# Patient Record
Sex: Female | Born: 1995 | Race: Black or African American | Hispanic: No | Marital: Married | Smoking: Never smoker
Health system: Southern US, Community
[De-identification: ages and names within clinical notes are randomized; demographics above are authoritative.]

## PROBLEM LIST (undated history)

## (undated) DIAGNOSIS — Z789 Other specified health status: Secondary | ICD-10-CM

## (undated) HISTORY — DX: Other specified health status: Z78.9

---

## 2017-06-11 HISTORY — PX: KNEE SURGERY: SHX244

## 2017-10-07 DIAGNOSIS — E049 Nontoxic goiter, unspecified: Secondary | ICD-10-CM | POA: Insufficient documentation

## 2017-12-03 DIAGNOSIS — S86812A Strain of other muscle(s) and tendon(s) at lower leg level, left leg, initial encounter: Secondary | ICD-10-CM | POA: Insufficient documentation

## 2020-11-21 ENCOUNTER — Ambulatory Visit (INDEPENDENT_AMBULATORY_CARE_PROVIDER_SITE_OTHER): Payer: Managed Care, Other (non HMO) | Admitting: Certified Nurse Midwife

## 2020-11-21 ENCOUNTER — Other Ambulatory Visit: Payer: Self-pay

## 2020-11-21 VITALS — BP 127/76 | HR 80 | Ht 65.0 in | Wt 188.6 lb

## 2020-11-21 DIAGNOSIS — N926 Irregular menstruation, unspecified: Secondary | ICD-10-CM

## 2020-11-21 MED ORDER — DOXYLAMINE-PYRIDOXINE 10-10 MG PO TBEC: 1.0000 | DELAYED_RELEASE_TABLET | Freq: Four times a day (QID) | ORAL | 5 refills | Status: AC

## 2020-11-21 NOTE — Progress Notes (Signed)
Subjective:    Stacey Sanford is a 25 y.o. female who presents for evaluation of amenorrhea, Positive pregnancy test [facility].. She believes she could be pregnant. Pregnancy is desired. Sexual Activity: single partner, contraception: none. Current symptoms also include: fatigue and nausea. Last period was unsure of dates.   No LMP recorded. The following portions of the patient's history were reviewed and updated as appropriate: allergies, current medications, past family history, past medical history, past social history, past surgical history, and problem list.  Review of Systems Pertinent items are noted in HPI.     Objective:    There were no vitals taken for this visit. General: alert, cooperative, appears stated age, and no acute distress    Lab Review Urine HCG:  not needed pt has at [facility] May.     Assessment:    Absence of menstruation.     Plan:    Pregnancy Test: Positive: EDC: 06/02/21. Briefly discussed pre-natal care options. Md and midwifery care reviewed.  Encouraged well-balanced diet, plenty of rest when needed, pre-natal vitamins daily and walking for exercise. Discussed self-help for nausea, avoiding OTC medications until consulting provider or pharmacist, other than Tylenol as needed, minimal caffeine (1-2 cups daily) and avoiding alcohol. She will schedule her initial OB visit in the next week.  Feel free to call with any questions.  Orders placed for diclegis.   Philip Aspen, CNM

## 2020-11-24 ENCOUNTER — Ambulatory Visit
Admission: RE | Admit: 2020-11-24 | Discharge: 2020-11-24 | Disposition: A | Discharge status: Home / Self Care | Payer: Self-pay | Source: Ambulatory Visit | Attending: Certified Nurse Midwife | Admitting: Certified Nurse Midwife

## 2020-11-24 ENCOUNTER — Other Ambulatory Visit: Payer: Self-pay

## 2020-11-24 ENCOUNTER — Other Ambulatory Visit: Payer: Self-pay | Admitting: Certified Nurse Midwife

## 2020-11-24 DIAGNOSIS — N926 Irregular menstruation, unspecified: Secondary | ICD-10-CM | POA: Insufficient documentation

## 2020-11-28 ENCOUNTER — Ambulatory Visit (INDEPENDENT_AMBULATORY_CARE_PROVIDER_SITE_OTHER): Payer: Self-pay

## 2020-11-28 ENCOUNTER — Other Ambulatory Visit: Payer: Self-pay

## 2020-11-28 VITALS — BP 132/77 | HR 83 | Ht 65.0 in | Wt 187.9 lb

## 2020-11-28 DIAGNOSIS — Z0283 Encounter for blood-alcohol and blood-drug test: Secondary | ICD-10-CM | POA: Diagnosis not present

## 2020-11-28 DIAGNOSIS — R638 Other symptoms and signs concerning food and fluid intake: Secondary | ICD-10-CM | POA: Diagnosis not present

## 2020-11-28 DIAGNOSIS — Z3401 Encounter for supervision of normal first pregnancy, first trimester: Secondary | ICD-10-CM

## 2020-11-28 DIAGNOSIS — Z113 Encounter for screening for infections with a predominantly sexual mode of transmission: Secondary | ICD-10-CM

## 2020-11-28 NOTE — Progress Notes (Signed)
      Jase Himmelberger presents for NOB nurse intake visit. Pregnancy confirmation done at [facility] ,11/21/2020, with Philip Aspen.  G.  P.  LMP3/18/2022.  EDD.  [id number]. Pregnancy education material explained and given.  0 cats in the home.  NOB labs ordered. BMI greater than 30. TSH/HbgA1c ordered. Sickle cell order due to race. HIV and drug screen explained and ordered. Genetic screening discussed. Genetic testing; Pt is aware that genetic testing will be completed during her NOB PE with the provider. Pt to discuss genetic testing with provider. PNV encouraged. Pt to follow up with provider in 1 weeks for NOB physical.   FMLA, [place], HIV/Drug Screening forms all reviewed and signed by patient.    Pt requested to see the providers over the midwives.

## 2020-11-28 NOTE — Patient Instructions (Signed)
WHAT OB PATIENTS CAN EXPECT  Confirmation of pregnancy and ultrasound ordered if medically indicated-[redacted] weeks gestation New OB (NOB) intake with nurse and New OB (NOB) labs- [redacted] weeks gestation New OB (NOB) physical examination with provider- 11/[redacted] weeks gestation Flu vaccine-[redacted] weeks gestation Anatomy scan-[redacted] weeks gestation Glucose tolerance test, blood work to test for anemia, T-dap vaccine-[redacted] weeks gestation Vaginal swabs/cultures-STD/Group B strep-[redacted] weeks gestation Appointments every 4 weeks until 28 weeks Every 2 weeks from 28 weeks until 36 weeks Weekly visits from 36 weeks until delivery  Tests and Screening During Pregnancy Having certain tests and screenings during pregnancy is an important part of your prenatal care. These tests help your health care provider find problems that might affect your pregnancy. Some tests must be done for all pregnant women, and some are optional. Most of the tests and screenings do not pose any risks for you or your baby. You may need additional testing if any routinetests indicate a problem. Tests and screenings done early in pregnancy Some tests and screenings you can expect to have in early pregnancy include: Blood tests, such as: Complete blood count (CBC). This test is done to check your red and white blood cells. It can help identify a risk for anemia, infection, or bleeding. Blood typing. This test shows your blood type. It also shows whether you have a certain protein in your red blood cells called the Rh factor. It can be dangerous for your baby if you do not have this protein (Rh negative) and your baby has it (Rh positive). Tests to check for diseases that can cause birth defects or can be passed to your baby, such as: [place] measles (rubella) and chicken pox. The test indicates whether you are immune to these diseases. Hepatitis B and C. Human Immunodeficiency Virus (HIV). Syphilis. Zika virus, if you or your partner has traveled to an area  where the virus occurs. Urine testing. This checks for sugar in your urine and for signs of infection. Blood pressure. This is to check for high blood pressure and preeclampsia. Testing for sexually transmitted infections (STIs), such as chlamydia or gonorrhea. Testing for tuberculosis. You may have this skin test if you are at risk for tuberculosis. Fetal ultrasound. This is an imaging study of your growing baby. It uses sound waves to create pictures of your baby. This test may be done to help determine your due date and to ensure you do not have an ectopic pregnancy. An ectopic pregnancy is a pregnancy that grows outside of the uterus. Tests and screenings done later in pregnancy Certain tests are done for the first time later in the pregnancy. Some of the tests that were done in early pregnancy are repeated at this time. Some common tests you can expect to have later in pregnancy include: Rh antibody testing. If you are Rh negative, you will have a blood test at about 28 weeks of pregnancy to see if you are producing Rh antibodies. If you have not started to make antibodies, you will be given an injection to prevent you from making antibodies for the rest of your pregnancy. Glucose screening. This checks your blood sugar. It will show whether you are developing the type of diabetes that occurs during pregnancy (gestational diabetes). You may have this screening earlier if you have risk factors for diabetes. Screening for group B streptococcus (GBS). GBS is a type of bacteria that may live in your rectum or vagina. GBS can spread to your baby during birth. This  is done at 35-37 weeks of pregnancy. If testing is positive for GBS, you may be treated with antibiotic medicine. Urine and blood tests to monitor for other pregnancy problems, such as preeclampsia or anemia. Blood pressure to monitor for high blood pressure and preeclampsia. Fetal ultrasound. This may be repeated at 16-20 weeks to check how  your baby is growing and developing. Non-stress test. This test is done later in pregnancy to check your baby's heart rate. This may be repeated weekly if your pregnancy is high risk. Biophysical profile. This test includes ultrasound imaging and a non-stress test to ensure your baby is healthy. This test may help decide when your baby should be born. Screening for birth defects Some birth defects are caused by abnormal genes passed down through families. Early in your pregnancy, tests can be done to find out if your baby is at risk for a genetic disorder. This testing is optional. The type of testing recommended for you will depend on your family and medical history, your ethnicity, and your age. Testing may include: Screening tests. These tests may include an ultrasound, blood tests, or a combination of both. The blood tests are used to check for abnormal genes and the ultrasound is done to look for early birth defects. Carrier screening. This test involves checking the blood or saliva of both parents to see if they carry abnormal genes that could be passed down to a baby. If genetic screening shows that your baby is at risk for a genetic defect, additional diagnostic testing may be recommended, such as: Amniocentesis. This involves testing a sample of fluid from your womb (amniotic fluid). Chorionic villus sampling. In this test, a sample of cells from your placenta is checked for abnormal cells. Unlike other tests done during pregnancy, diagnostic testing does have some risk for your pregnancy. Talk to your health care provider about the risks andbenefits of genetic testing. Questions to ask your health care provider What routine tests are recommended for me? When and how will these tests be done? When will I get the results of routine tests? What do the results of these tests mean for me or my baby? Do you recommend any genetic screening tests? Which ones? Should I see a genetic counselor  before having genetic screening? Where to find more information American Pregnancy Association: americanpregnancy.org/prenatal-testing [employer]: JewelryExec.com.pt Office on [employer] Health: KeywordPortfolios.com.br [employer]: marchofdimes.org/pregnancy Summary Having certain tests and screenings during pregnancy is an important part of your prenatal care. Talk to your health care provider about what tests are right for you and your baby. In early pregnancy, testing may be done to check your risks for various conditions that can affect you and your baby. Later in pregnancy, tests may be done to ensure that your baby is growing normally and that you and your baby are staying healthy during the pregnancy. Genetic testing is optional. Talk to your health care provider about the risks and benefits of genetic testing. This information is not intended to replace advice given to you by your health care provider. Make sure you discuss any questions you have with your healthcare provider. Document Revised: 02/16/2020 Document Reviewed: 02/16/2020 Elsevier Patient Education  2022 [place] [employer] Pregnancy  The second trimester of pregnancy is from week 13 through week 27. This is also called months 4 through 6 of pregnancy. This is often the time when you feelyour best. During the second trimester: Morning sickness is less or  has stopped. You may have more energy. You may feel hungry more often. At this time, your unborn baby (fetus) is growing very fast. At the end of the sixth month, the unborn baby may be up to 12 inches long and weigh about 1 pounds. You will likely start to feelthe baby move between 16 and 20 weeks of pregnancy. Body changes during your second trimester Your body continues to go through many changes during this time. The changesvary and generally return to normal after the baby is born. Physical changes You will gain  more weight. You may start to get stretch marks on your hips, belly (abdomen), and breasts. Your breasts will grow and may hurt. Dark spots or blotches may develop on your face. A dark line from your belly button to the pubic area (linea nigra) may appear. You may have changes in your hair. Health changes You may have headaches. You may have heartburn. You may have trouble pooping (constipation). You may have hemorrhoids or swollen, bulging veins (varicose veins). Your gums may bleed. You may pee (urinate) more often. You may have back pain. Follow these instructions at home: Medicines Take over-the-counter and prescription medicines only as told by your doctor. Some medicines are not safe during pregnancy. Take a prenatal vitamin that contains at least 600 micrograms (mcg) of folic acid. Eating and drinking Eat healthy meals that include: Fresh fruits and vegetables. Whole grains. Good sources of protein, such as meat, eggs, or tofu. Low-fat dairy products. Avoid raw meat and unpasteurized juice, milk, and cheese. You may need to take these actions to prevent or treat trouble pooping: Drink enough fluids to keep your pee (urine) pale yellow. Eat foods that are high in fiber. These include beans, whole grains, and fresh fruits and vegetables. Limit foods that are high in fat and sugar. These include fried or sweet foods. Activity Exercise only as told by your doctor. Most people can do their usual exercise during pregnancy. Try to exercise for 30 minutes at least 5 days a week. Stop exercising if you have pain or cramps in your belly or lower back. Do not exercise if it is too hot or too humid, or if you are in a place of great height (high altitude). Avoid heavy lifting. If you choose to, you may have sex unless your doctor tells you not to. Relieving pain and discomfort Wear a good support bra if your breasts are sore. Take warm water baths (sitz baths) to soothe pain or  discomfort caused by hemorrhoids. Use hemorrhoid cream if your doctor approves. Rest with your legs raised (elevated) if you have leg cramps or low back pain. If you develop bulging veins in your legs: Wear support hose as told by your doctor. Raise your feet for 15 minutes, 3-4 times a day. Limit salt in your food. Safety Wear your seat belt at all times when you are in a car. Talk with your doctor if someone is hurting you or yelling at you a lot. Lifestyle Do not use hot tubs, steam rooms, or saunas. Do not douche. Do not use tampons or scented sanitary pads. Avoid cat litter boxes and soil used by cats. These carry germs that can harm your baby and can cause a loss of your baby by miscarriage or stillbirth. Do not use herbal medicines, illegal drugs, or medicines that are not approved by your doctor. Do not drink alcohol. Do not smoke or use any products that contain nicotine or tobacco. If you need  help quitting, ask your doctor. General instructions Keep all follow-up visits. This is important. Ask your doctor about local prenatal classes. Ask your doctor about the right foods to eat or for help finding a counselor. Where to find more information American Pregnancy Association: americanpregnancy.org [employer]ians [employer]ologists: www.acog.org Office on [employer] Health: KeywordPortfolios.com.br Contact a doctor if: You have a headache that does not go away when you take medicine. You have changes in how you see, or you see spots in front of your eyes. You have mild cramps, pressure, or pain in your lower belly. You continue to feel like you may vomit (nauseous), you vomit, or you have watery poop (diarrhea). You have bad-smelling fluid coming from your vagina. You have pain when you pee or your pee smells bad. You have very bad swelling of your face, hands, ankles, feet, or legs. You have a fever. Get help right away if: You are leaking fluid from your  vagina. You have spotting or bleeding from your vagina. You have very bad belly cramping or pain. You have trouble breathing. You have chest pain. You faint. You have not felt your baby move for the time period told by your doctor. You have new or increased pain, swelling, or redness in an arm or leg. Summary The second trimester of pregnancy is from week 13 through week 27 (months 4 through 6). Eat healthy meals. Exercise as told by your doctor. Most people can do their usual exercise during pregnancy. Do not use herbal medicines, illegal drugs, or medicines that are not approved by your doctor. Do not drink alcohol. Call your doctor if you get sick or if you notice anything unusual about your pregnancy. This information is not intended to replace advice given to you by your health care provider. Make sure you discuss any questions you have with your healthcare provider. Document Revised: 11/04/2019 Document Reviewed: 09/10/2019 Elsevier Patient Education  [place]. Morning Sickness  Morning sickness is when you feel like you may vomit (feel nauseous) during pregnancy. Sometimes, you may vomit. Morning sickness most often happens in the morning, but it can also happen at any time of the day. Some women may have morning sickness that makes them vomit all the time. This is amore serious problem that needs treatment. What are the causes? The cause of this condition is not known. What increases the risk? You had vomiting or a feeling like you may vomit before your pregnancy. You had morning sickness in another pregnancy. You are pregnant with more than one baby, such as twins. What are the signs or symptoms? Feeling like you may vomit. Vomiting. How is this treated? Treatment is usually not needed for this condition. You may only need to change what you eat. In some cases, your doctor may give you some things to take for your condition. These include: Vitamin B6  supplements. Medicines to treat the feeling that you may vomit. Ginger. Follow these instructions at home: Medicines Take over-the-counter and prescription medicines only as told by your doctor. Do not take any medicines until you talk with your doctor about them first. Take multivitamins before you get pregnant. These can stop or lessen the symptoms of morning sickness. Eating and drinking Eat dry toast or crackers before getting out of bed. Eat 5 or 6 small meals a day. Eat dry and bland foods like rice and baked potatoes. Do not eat greasy, fatty, or spicy foods. Have someone cook for you if the smell  of food causes you to vomit or to feel like you may vomit. If you feel like you may vomit after taking prenatal vitamins, take them at night or with a snack. Eat protein foods when you need a snack. Nuts, yogurt, and cheese are good choices. Drink fluids throughout the day. Try ginger ale made with real ginger, ginger tea made from fresh grated ginger, or ginger candies. General instructions Do not smoke or use any products that contain nicotine or tobacco. If you need help quitting, ask your doctor. Use an air purifier to keep the air in your house free of smells. Get lots of fresh air. Try to avoid smells that make you feel sick. Try wearing an acupressure wristband. This is a wristband that is used to treat seasickness. Try a treatment called acupuncture. In this treatment, a doctor puts needles into certain areas of your body to make you feel better. Contact a doctor if: You need medicine to feel better. You feel dizzy or light-headed. You are losing weight. Get help right away if: The feeling that you may vomit will not go away, or you cannot stop vomiting. You faint. You have very bad pain in your belly. Summary Morning sickness is when you feel like you may vomit (feel nauseous) during pregnancy. You may feel sick in the morning, but you can feel this way at any time of the  day. Making some changes to what you eat may help your symptoms go away. This information is not intended to replace advice given to you by your health care provider. Make sure you discuss any questions you have with your healthcare provider. Document Revised: 01/11/2020 Document Reviewed: 12/21/2019 Elsevier Patient Education  2022 [employer]. How a Baby Grows During Pregnancy Pregnancy begins when a female's sperm enters a female's egg. This is called fertilization. Fertilization usually happens in one of the fallopian tubes that connect the ovaries to the uterus. The fertilized egg moves down the fallopian tube to the uterus. Once it reaches the uterus, it implants into the lining ofthe uterus and begins to grow. For the first 8 weeks, the fertilized egg is called an embryo. After 8 weeks, it is called a fetus. As the fetus continues to grow, it receives oxygen and nutrients through the placenta, which is an organ that grows to support the developing baby. The placenta is the life support system for the baby. Itprovides oxygen and nutrition and removes waste. How long does a typical pregnancy last? A pregnancy usually lasts 280 days, or about 40 weeks. Pregnancy is divided into three periods of growth, also called trimesters: First trimester: 0-12 weeks. Second trimester: 13-27 weeks. Third trimester: 28-40 weeks. The day when your baby is ready to be born (full term) is your estimated date of delivery. However, most babies are not born ontheir estimated date of delivery. How does my baby develop month by month?  First month The fertilized egg attaches to the inside of the uterus. Some cells will form the placenta. Others will form the fetus. The arms, legs, brain, spinal cord, lungs, and heart begin to develop. At the end of the first month, the heart begins to beat. Second month The bones, inner ear, eyelids, hands, and feet form. The genitals develop. By the end of 8 weeks, all major  organs are developing. Third month All of the internal organs are forming. Teeth develop below the gums. Bones and muscles begin to grow. The spine can flex. The skin is transparent. Fingernails  and toenails begin to form. Arms and legs continue to grow longer, and hands and feet develop. The fetus is about 3 inches (7.6 cm) long. Fourth month The placenta is completely formed. The external sex organs, neck, outer ear, eyebrows, eyelids, and fingernails are formed. The fetus can hear, swallow, and move its arms and legs. The kidneys begin to produce urine. The skin is covered with a white, waxy coating (vernix) and very fine hair (lanugo). Fifth month The fetus moves around more and can be felt for the first time (quickening). The fetus starts to sleep and wake up and may begin to suck a finger. The nails grow to the end of the fingers. The organ in the digestive system that makes bile (gallbladder) functions and helps to digest nutrients. If the fetus is a female, eggs are present in the ovaries. If the fetus is a female, testicles start to move down into the scrotum. Sixth month The lungs are formed. The eyes open. The brain continues to develop. Your baby has fingerprints and toe prints. Your baby's hair grows thicker. At the end of the second trimester, the fetus is about 9 inches (22.9 cm) long. Seventh month The fetus kicks and stretches. The eyes are developed enough to sense changes in light. The hands can make a grasping motion. The fetus responds to sound. Eighth month Most organs and body systems are fully developed and functioning. Bones harden, and taste buds develop. The fetus may hiccup. Certain areas of the brain are still developing. The skull remains soft. Ninth month The fetus gains about  lb (0.23 kg) each week. The lungs are fully developed. Patterns of sleep develop. The fetus's head typically moves into a head-down position (vertex) in the uterus to  prepare for birth. The fetus weighs 6-9 lb (2.72-4.08 kg) and is 19-20 inches (48.26-50.8 cm) long. How do I know if my baby is developing well? Always talk with your health care provider about any concerns that you may have about your pregnancy and your baby. At each prenatal visit, your health care provider will do several different tests to check on your health and keep track of your baby's development. These include: Fundal height and position. To do this, your health care provider will: Measure your growing belly from your pubic bone to the top of the uterus using a tape measure. Feel your belly to determine your baby's position. Heartbeat. An ultrasound in the first trimester can confirm pregnancy and show a heartbeat, depending on how far along you are. Your health care provider will check your baby's heart rate at every prenatal visit. You will also have a second trimester ultrasound to check your baby'sdevelopment. Follow these instructions at home: Take prenatal vitamins as told by your health care provider. These include vitamins such as folic acid, iron, calcium, and vitamin D. They are important for healthy development. Take over-the-counter and prescription medicines only as told by your health care provider. Keep all follow-up visits. This is important. Follow-up visits include prenatal care and screening tests. Summary A pregnancy usually lasts 280 days, or about 40 weeks. Pregnancy is divided into three periods of growth, also called trimesters. Your health care provider will monitor your baby's growth and development throughout your pregnancy. Follow your health care provider's recommendations about taking prenatal vitamins and medicines during your pregnancy. Talk with your health care provider if you have any concerns about your pregnancy or your developing baby. This information is not intended to replace advice given to  you by your health care provider. Make sure you discuss  any questions you have with your healthcare provider. Document Revised: 11/04/2019 Document Reviewed: 09/10/2019 Elsevier Patient Education  2022 [place]. Commonly Asked Questions During Pregnancy  Cats: A parasite can be excreted in cat feces.  To avoid exposure you need to have another person empty the little box.  If you must empty the litter box you will need to wear gloves.  Wash your hands after handling your cat.  This parasite can also be found in raw or undercooked meat so this should also be avoided.  Colds, Sore Throats, Flu: Please check your medication sheet to see what you can take for symptoms.  If your symptoms are unrelieved by these medications please call the office.  Dental Work: Most any dental work Investment banker, corporate recommends is permitted.  X-rays should only be taken during the first trimester if absolutely necessary.  Your abdomen should be shielded with a lead apron during all x-rays.  Please notify your provider prior to receiving any x-rays.  Novocaine is fine; gas is not recommended.  If your dentist requires a note from [place] prior to dental work please call the office and we will provide one for you.  Exercise: Exercise is an important part of staying healthy during your pregnancy.  You may continue most exercises you were accustomed to prior to pregnancy.  Later in your pregnancy you will most likely notice you have difficulty with activities requiring balance like riding a bicycle.  It is important that you listen to your body and avoid activities that put you at a higher risk of falling.  Adequate rest and staying well hydrated are a must!  If you have questions about the safety of specific activities ask your provider.    Exposure to Children with illness: Try to avoid obvious exposure; report any symptoms to [place] when noted,  If you have chicken pos, red measles or mumps, you should be immune to these diseases.   Please do not take any vaccines while pregnant unless you  have checked with your OB provider.  Fetal Movement: After 28 weeks we recommend you do "kick counts" twice daily.  Lie or sit down in a calm quiet environment and count your baby movements "kicks".  You should feel your baby at least 10 times per hour.  If you have not felt 10 kicks within the first hour get up, walk around and have something sweet to eat or drink then repeat for an additional hour.  If count remains less than 10 per hour notify your provider.  Fumigating: Follow your pest control agent's advice as to how long to stay out of your home.  Ventilate the area well before re-entering.  Hemorrhoids:   Most over-the-counter preparations can be used during pregnancy.  Check your medication to see what is safe to use.  It is important to use a stool softener or fiber in your diet and to drink lots of liquids.  If hemorrhoids seem to be getting worse please call the office.   Hot Tubs:  Hot tubs Jacuzzis and saunas are not recommended while pregnant.  These increase your internal body temperature and should be avoided.  Intercourse:  Sexual intercourse is safe during pregnancy as long as you are comfortable, unless otherwise advised by your provider.  Spotting may occur after intercourse; report any bright red bleeding that is heavier than spotting.  Labor:  If you know that you are in labor, please  go to the hospital.  If you are unsure, please call the office and let us help you decide what to do.  Lifting, straining, etc:  If your job requires heavy lifting or straining please check with your provider for any limitations.  Generally, you should not lift items heavier than that you can lift simply with your hands and arms (no back muscles)  Painting:  Paint fumes do not harm your pregnancy, but may make you ill and should be avoided if possible.  Latex or water based paints have less odor than oils.  Use adequate ventilation while painting.  Permanents & Hair Color:  Chemicals in hair  dyes are not recommended as they cause increase hair dryness which can increase hair loss during pregnancy.  " Highlighting" and permanents are allowed.  Dye may be absorbed differently and permanents may not hold as well during pregnancy.  Sunbathing:  Use a sunscreen, as skin burns easily during pregnancy.  Drink plenty of fluids; avoid over heating.  Tanning Beds:  Because their possible side effects are still unknown, tanning beds are not recommended.  Ultrasound Scans:  Routine ultrasounds are performed at approximately 20 weeks.  You will be able to see your baby's general anatomy an if you would like to know the gender this can usually be determined as well.  If it is questionable when you conceived you may also receive an ultrasound early in your pregnancy for dating purposes.  Otherwise ultrasound exams are not routinely performed unless there is a medical necessity.  Although you can request a scan we ask that you pay for it when conducted because insurance does not cover " patient request" scans.  Work: If your pregnancy proceeds without complications you may work until your due date, unless your physician or employer advises otherwise.  Round Ligament Pain/Pelvic Discomfort:  Sharp, shooting pains not associated with bleeding are fairly common, usually occurring in the second trimester of pregnancy.  They tend to be worse when standing up or when you remain standing for long periods of time.  These are the result of pressure of certain pelvic ligaments called "round ligaments".  Rest, Tylenol and heat seem to be the most effective relief.  As the womb and fetus grow, they rise out of the pelvis and the discomfort improves.  Please notify the office if your pain seems different than that described.  It may represent a more serious condition.  Common Medications Safe in Pregnancy  Acne:      Constipation:  Benzoyl Peroxide     Colace  Clindamycin      Dulcolax Suppository  Topica  Erythromycin     Fibercon  Salicylic Acid      Metamucil         Miralax AVOID:        Senakot   Accutane    Cough:  Retin-A       Cough Drops  Tetracycline      Phenergan w/ Codeine if Rx  Minocycline      Robitussin (Plain & DM)  Antibiotics:     Crabs/Lice:  Ceclor       RID  Cephalosporins    AVOID:  E-Mycins      Kwell  Keflex  Macrobid/Macrodantin   Diarrhea:  Penicillin      Kao-Pectate  Zithromax      Imodium AD         PUSH FLUIDS AVOID:       Cipro  Fever:  Tetracycline      Tylenol (Regular or Extra  Minocycline       Strength)  Levaquin      Extra Strength-Do not          Exceed 8 tabs/24 hrs Caffeine:        <268m/day (equiv. To 1 cup of coffee or  approx. 3 12 oz sodas)         Gas: Cold/Hayfever:       Gas-X  Benadryl      Mylicon  Claritin       Phazyme  **Claritin-D        Chlor-Trimeton    Headaches:  Dimetapp      ASA-Free Excedrin  Drixoral-Non-Drowsy     Cold Compress  Mucinex (Guaifenasin)     Tylenol (Regular or Extra  Sudafed/Sudafed-12 Hour     Strength)  **Sudafed PE Pseudoephedrine   Tylenol Cold & Sinus     Vicks Vapor Rub  Zyrtec  **AVOID if Problems With Blood Pressure         Heartburn: Avoid lying down for at least 1 hour after meals  Aciphex      Maalox     Rash:  Milk of Magnesia     Benadryl    Mylanta       1% Hydrocortisone Cream  Pepcid  Pepcid Complete   Sleep Aids:  Prevacid      Ambien   Prilosec       Benadryl  Rolaids       Chamomile Tea  Tums (Limit 4/day)     Unisom         Tylenol PM         Warm milk-add vanilla or  Hemorrhoids:       Sugar for taste  Anusol/Anusol H.C.  (RX: Analapram 2.5%)  Sugar Substitutes:  Hydrocortisone OTC     Ok in moderation  Preparation H      Tucks        Vaseline lotion applied to tissue with wiping    Herpes:     Throat:  Acyclovir      Oragel  Famvir  Valtrex     Vaccines:         Flu Shot Leg Cramps:       *Gardasil  Benadryl      Hepatitis  A         Hepatitis B Nasal Spray:       Pneumovax  Saline Nasal Spray     Polio Booster         Tetanus Nausea:       Tuberculosis test or PPD  Vitamin B6 25 mg TID   AVOID:    Dramamine      *Gardasil  Emetrol       Live Poliovirus  Ginger Root 250 mg QID    MMR (measles, mumps &  High Complex Carbs @ Bedtime    rebella)  Sea Bands-Accupressure    Varicella (Chickenpox)  Unisom 1/2 tab TID     *No known complications           If received before Pain:         Known pregnancy;   Darvocet       Resume series after  Lortab        Delivery  Percocet    Yeast:   Tramadol      Femstat  Tylenol 3      Gyne-lotrimin  Ultram  Monistat  Vicodin           MISC:         All Sunscreens           Hair Coloring/highlights          Insect Repellant's          (Including DEET)         Mystic Tans

## 2020-11-29 LAB — GC/CHLAMYDIA PROBE AMP
Chlamydia trachomatis, NAA: NEGATIVE
Neisseria Gonorrhoeae by PCR: NEGATIVE

## 2020-11-29 LAB — DRUG PROFILE, UR, 9 DRUGS (LABCORP)
Amphetamines, Urine: NEGATIVE ng/mL
Barbiturate Quant, Ur: NEGATIVE ng/mL
Benzodiazepine Quant, Ur: NEGATIVE ng/mL
Cannabinoid Quant, Ur: NEGATIVE ng/mL
Cocaine (Metab.): NEGATIVE ng/mL
Methadone Screen, Urine: NEGATIVE ng/mL
Opiate Quant, Ur: NEGATIVE ng/mL
PCP Quant, Ur: NEGATIVE ng/mL
Propoxyphene: NEGATIVE ng/mL

## 2020-11-29 LAB — URINALYSIS, ROUTINE W REFLEX MICROSCOPIC

## 2020-11-29 LAB — NICOTINE SCREEN, URINE: Cotinine Ql Scrn, Ur: NEGATIVE ng/mL

## 2020-11-30 LAB — CULTURE, OB URINE

## 2020-11-30 LAB — URINE CULTURE, OB REFLEX

## 2020-12-13 ENCOUNTER — Other Ambulatory Visit: Payer: Self-pay

## 2020-12-14 ENCOUNTER — Encounter: Payer: Self-pay | Admitting: Certified Nurse Midwife

## 2020-12-14 ENCOUNTER — Other Ambulatory Visit: Payer: Managed Care, Other (non HMO)

## 2020-12-16 ENCOUNTER — Encounter: Payer: Self-pay | Admitting: Obstetrics and Gynecology

## 2020-12-23 ENCOUNTER — Encounter: Payer: Self-pay | Admitting: Obstetrics and Gynecology

## 2020-12-26 ENCOUNTER — Telehealth: Payer: Self-pay | Admitting: Certified Nurse Midwife

## 2020-12-26 ENCOUNTER — Other Ambulatory Visit: Payer: Self-pay | Admitting: Certified Nurse Midwife

## 2020-12-26 NOTE — Telephone Encounter (Signed)
Patient called back in regards to pain. She is having constant frequent pains that feel like contraction for a few hours and worsening in severity. She denies bleeding. Instructed patient to proceed to ED or UC if pains are worsening. No physician in office at this time. Told her we could fit her in after 1:30 when Deneise Lever is back. Told her that we would call her back asap. Consulted with Deneise Lever at 1:30. She told me that pain was possible round ligament pain and to call patient back and she would talk with her. Tried to call patient at 1:30, no answer. Called again soon after, no answer.

## 2020-12-26 NOTE — Telephone Encounter (Signed)
Pt called stating that she has been having contraction like pain for a few hours, describes as constant but fluctuation in severity. Pt denies any bleeding or any other symptoms. Pt states that she is currently [redacted] weeks pregnant. Pt last saw Deneise Lever- but scheduled for New OB with [facility] on 22nd. Please Advise.

## 2020-12-26 NOTE — Telephone Encounter (Signed)
Spoke with the pt over the phone . Pt states that the pain is constant , she denies that it is coming and going. She denies that it has worsened and admits that it started last night when she was more active. She has not taken anything for pain. She feels fetal movement,denies discharge , and bleeding. She denies signs of vaginal infection and UTI. She does have a 7.5 cm probable anterior fibroid. Discussed fibroids in pregnancy. Red flag symptoms reviewed. Encouraged rest, hydration, heat pack, bath, tylenol for discomfort. Follow up in ED if pain worsens. She verbalizes and agrees to plan .   Philip Aspen, CNM

## 2020-12-27 ENCOUNTER — Emergency Department: Payer: Managed Care, Other (non HMO)

## 2020-12-27 ENCOUNTER — Emergency Department
Admission: EM | Admit: 2020-12-27 | Discharge: 2020-12-27 | Disposition: A | Discharge status: Home / Self Care | Payer: Managed Care, Other (non HMO) | Attending: Emergency Medicine | Admitting: Emergency Medicine

## 2020-12-27 ENCOUNTER — Telehealth: Payer: Self-pay

## 2020-12-27 ENCOUNTER — Other Ambulatory Visit: Payer: Self-pay

## 2020-12-27 DIAGNOSIS — R1030 Lower abdominal pain, unspecified: Secondary | ICD-10-CM

## 2020-12-27 DIAGNOSIS — O2312 Infections of bladder in pregnancy, second trimester: Secondary | ICD-10-CM | POA: Diagnosis not present

## 2020-12-27 DIAGNOSIS — R102 Pelvic and perineal pain: Secondary | ICD-10-CM | POA: Insufficient documentation

## 2020-12-27 DIAGNOSIS — B9689 Other specified bacterial agents as the cause of diseases classified elsewhere: Secondary | ICD-10-CM | POA: Diagnosis not present

## 2020-12-27 DIAGNOSIS — Z3A17 17 weeks gestation of pregnancy: Secondary | ICD-10-CM | POA: Diagnosis not present

## 2020-12-27 DIAGNOSIS — O26892 Other specified pregnancy related conditions, second trimester: Secondary | ICD-10-CM | POA: Diagnosis present

## 2020-12-27 LAB — URINALYSIS, COMPLETE (UACMP) WITH MICROSCOPIC
Bilirubin Urine: NEGATIVE
Glucose, UA: NEGATIVE mg/dL
Hgb urine dipstick: NEGATIVE
Ketones, ur: NEGATIVE mg/dL
Nitrite: NEGATIVE
Protein, ur: NEGATIVE mg/dL
Specific Gravity, Urine: 1.019 (ref 1.005–1.030)
pH: 9 — ABNORMAL HIGH (ref 5.0–8.0)

## 2020-12-27 LAB — CBC
HCT: 34.3 % — ABNORMAL LOW (ref 36.0–46.0)
Hemoglobin: 11.3 g/dL — ABNORMAL LOW (ref 12.0–15.0)
MCH: 26.7 pg (ref 26.0–34.0)
MCHC: 32.9 g/dL (ref 30.0–36.0)
MCV: 80.9 fL (ref 80.0–100.0)
Platelets: 270 10*3/uL (ref 150–400)
RBC: 4.24 MIL/uL (ref 3.87–5.11)
RDW: 13.4 % (ref 11.5–15.5)
WBC: 9.5 10*3/uL (ref 4.0–10.5)
nRBC: 0 % (ref 0.0–0.2)

## 2020-12-27 LAB — COMPREHENSIVE METABOLIC PANEL
ALT: 47 U/L — ABNORMAL HIGH (ref 0–44)
AST: 34 U/L (ref 15–41)
Albumin: 3.7 g/dL (ref 3.5–5.0)
Alkaline Phosphatase: 55 U/L (ref 38–126)
Anion gap: 8 (ref 5–15)
BUN: 6 mg/dL (ref 6–20)
CO2: 26 mmol/L (ref 22–32)
Calcium: 9.5 mg/dL (ref 8.9–10.3)
Chloride: 100 mmol/L (ref 98–111)
Creatinine, Ser: 0.56 mg/dL (ref 0.44–1.00)
GFR, Estimated: >60 mL/min (ref >60)
Glucose, Bld: 81 mg/dL (ref 70–99)
Potassium: 3.6 mmol/L (ref 3.5–5.1)
Sodium: 134 mmol/L — ABNORMAL LOW (ref 135–145)
Total Bilirubin: 0.6 mg/dL (ref 0.3–1.2)
Total Protein: 7.5 g/dL (ref 6.5–8.1)

## 2020-12-27 LAB — LIPASE, BLOOD: Lipase: 24 U/L (ref 11–51)

## 2020-12-27 LAB — HCG, QUANTITATIVE, PREGNANCY: hCG, Beta Chain, Quant, S: 59619 m[IU]/mL — ABNORMAL HIGH (ref <5)

## 2020-12-27 LAB — POC URINE PREG, ED: Preg Test, Ur: POSITIVE — AB

## 2020-12-27 MED ORDER — CEPHALEXIN 500 MG PO CAPS: 500.0000 mg | ORAL_CAPSULE | Freq: Two times a day (BID) | ORAL | 0 refills | Status: AC

## 2020-12-27 NOTE — ED Notes (Signed)
See triage note  Presents with some abd pain  States she is [redacted] weeks pregnant  Was told that she had a small fibroid   States the pain has gotten worse  Was sent over by OB/GYN

## 2020-12-27 NOTE — ED Triage Notes (Signed)
Pt c/o lower abd pain for the past 3 days, pt is [redacted] weeks pregnant, states she went to her OB today and referred to the ED for ultrasound and further testing. Pt states she does have a fibroid.

## 2020-12-27 NOTE — Telephone Encounter (Signed)
Spoke with patient on phone in regards to Stacey Sanford message and patient having severe pain, recommended to patient per Deneise Lever to go to ED for pain and to request an [place] for fibroids. Confirmed with patient a work excuse will be sent to patient's MyChart. Pt verbalized understanding and agreed to plan, no further questions or concerns voiced from patient.

## 2020-12-27 NOTE — Telephone Encounter (Signed)
Pt reports very sharp pain on lower abdomen, consistent stabbing pain, gradually getting worse, pt unable to leave work to go to ED and needs work-note ASAP in order to be able to leave to either see [place] or ED. Pt would like to talk to provider as soon as possible.

## 2020-12-29 NOTE — ED Provider Notes (Signed)
[facility] Emergency Department Provider Note  ____________________________________________  Time seen: Approximately 12:04 PM  I have reviewed the triage vital signs and the nursing notes.   HISTORY  Chief Complaint Abdominal Pain    HPI Stacey Sanford is a 25 y.o. female presents to the emergency department for treatment and evaluation of lower abdominal pain x 3 days. She is [redacted] weeks gestation. OB referred her to the ER for evaluation. She denies fever, nausea, vomiting, diarrhea, or vaginal discharge/bleeding.   Past Medical History:  Diagnosis Date   No pertinent past medical history     There are no problems to display for this patient.   Past Surgical History:  Procedure Laterality Date   KNEE SURGERY  2019    Prior to Admission medications   Medication Sig Start Date End Date Taking? Authorizing Provider  cephALEXin (KEFLEX) 500 MG capsule Take 1 capsule (500 mg total) by mouth 2 (two) times daily for 7 days. 12/27/20 01/03/21 Yes Ladale Sherburn B, FNP  Doxylamine-Pyridoxine 10-10 MG TBEC Take 1 tablet by mouth 4 (four) times daily. Day 1 &2: 2 tablet at bedtimeDay 3 : if symptoms persists 1 tablet am; 2 tablet at bedtimeDay 4: 1 tablet am, 1 tab afternoon, 2 tab at bedtime 11/21/20   Philip Aspen, CNM  Prenatal Vit-Fe Fumarate-FA (PRENATAL MULTIVITAMIN) TABS tablet Take 1 tablet by mouth daily at 12 noon.    [provider]    Allergies Patient has no known allergies.  Family History  Problem Relation Age of Onset   Healthy Mother    Healthy Maternal Grandmother    Healthy Paternal Grandmother    Healthy Paternal Grandfather     Social History Social History   Tobacco Use   Smoking status: Never   Smokeless tobacco: Never  Vaping Use   Vaping Use: Never used  Substance Use Topics   Alcohol use: Not Currently   Drug use: Never    Review of Systems Constitutional: Negative for fever. Respiratory: Negative for  shortness of breath or cough. Gastrointestinal: Positive for abdominal pain; Negative for nausea , negative for vomiting. Genitourinary: Positive for dysuria , negative for vaginal discharge. Musculoskeletal: Negative for back pain. Skin: Negative for acute skin changes/rash/lesion. ____________________________________________   PHYSICAL EXAM:  VITAL SIGNS: ED Triage Vitals  Enc Vitals Group     BP 12/27/20 1345 123/74     Pulse Rate 12/27/20 1345 92     Resp 12/27/20 1345 17     Temp 12/27/20 1350 98.7 F (37.1 C)     Temp Source 12/27/20 1345 Oral     SpO2 12/27/20 1345 99 %     Weight 12/27/20 1346 188 lb (85.3 kg)     Height 12/27/20 1346 5\' 4"  (1.626 m)     Head Circumference --      Peak Flow --      Pain Score 12/27/20 1346 9     Pain Loc --      Pain Edu? --      Excl. in [place]? --     Constitutional: Alert and oriented. Well appearing and in no acute distress. Eyes: Conjunctivae are normal. Head: Atraumatic. Nose: No congestion/rhinnorhea. Mouth/Throat: Mucous membranes are moist. Respiratory: Normal respiratory effort.  No retractions. Gastrointestinal: Bowel sounds active x 4; Abdomen is soft without rebound or guarding. Genitourinary: Pelvic exam: deferred. Musculoskeletal: No extremity tenderness nor edema.  Neurologic:  Normal speech and language. No gross focal neurologic deficits are appreciated. Speech is normal.  No gait instability. Skin:  Skin is warm, dry and intact. No rash noted on exposed skin. Psychiatric: Mood and affect are normal. Speech and behavior are normal.  ____________________________________________   LABS (all labs ordered are listed, but only abnormal results are displayed)  Labs Reviewed  COMPREHENSIVE METABOLIC PANEL - Abnormal; Notable for the following components:      Result Value   Sodium 134 (*)    ALT 47 (*)    All other components within normal limits  CBC - Abnormal; Notable for the following components:   Hemoglobin  11.3 (*)    HCT 34.3 (*)    All other components within normal limits  URINALYSIS, COMPLETE (UACMP) WITH MICROSCOPIC - Abnormal; Notable for the following components:   Color, Urine YELLOW (*)    APPearance CLOUDY (*)    pH 9.0 (*)    Leukocytes,Ua LARGE (*)    Bacteria, UA RARE (*)    All other components within normal limits  HCG, QUANTITATIVE, PREGNANCY - Abnormal; Notable for the following components:   hCG, Beta Chain, Quant, S 59,619 (*)    All other components within normal limits  POC URINE PREG, ED - Abnormal; Notable for the following components:   Preg Test, Ur POSITIVE (*)    All other components within normal limits  LIPASE, BLOOD   ____________________________________________  RADIOLOGY  Single IUP 17w 4d ; FHR 169. 8cm anterior fibroid. ____________________________________________  Procedures  ____________________________________________   INITIAL IMPRESSION / ASSESSMENT AND PLAN / ED COURSE  25 year old gravid female at 78w 4d gestation presents for abdominal pain. [place] is reassuring with the exception of a previously known fibroid. Urinalysis is concerning for cystitis. Rx for Keflex and close follow up with gynecology recommended. She is to return to the ER for symptoms of concern if unable to schedule an appointment.  Pertinent labs & imaging results that were available during my care of the patient were reviewed by me and considered in my medical decision making (see chart for details).  ____________________________________________   FINAL CLINICAL IMPRESSION(S) / ED DIAGNOSES  Final diagnoses:  Acute cystitis during pregnancy in second trimester    Note:  This document was prepared using Dragon voice recognition software and may include unintentional dictation errors.   Victorino Dike, FNP 12/29/20 1211    Nance Pear, MD 12/30/20 [phone]

## 2020-12-29 NOTE — Progress Notes (Deleted)
OBSTETRIC INITIAL PRENATAL VISIT  Subjective:    Stacey Sanford is being seen today for her first obstetrical visit.  This {is/is not:9024} a planned pregnancy. She is a 25 y.o. G1P0 female at [redacted]w[redacted]d gestation, Estimated Date of Delivery: 06/02/21 with Patient's last menstrual period was 08/26/2020 (exact date).,  consistent with *** week sono. Her obstetrical history is significant for {ob risk factors:10154}. Relationship with FOB: {fob:16621}. Patient {does/does not:19097} intend to breast feed. Pregnancy history fully reviewed.    OB History  Gravida Para Term Preterm AB Living  1 0 0 0 0 0  SAB IAB Ectopic Multiple Live Births  0 0 0 0 0    # Outcome Date GA Lbr Len/2nd Weight Sex Delivery Anes PTL Lv  1 Current             Gynecologic History:  Last pap smear was ***.  Results were {Normal/Abnormal:304960160}. {Reports/denies:60888} h/o abnormal pap smears in the past.  denies {SYMPTOMS:60889} history of STIs.  Contraception prior to conception:    Past Medical History:  Diagnosis Date   No pertinent past medical history     Family History  Problem Relation Age of Onset   Healthy Mother    Healthy Maternal Grandmother    Healthy Paternal Grandmother    Healthy Paternal Grandfather     Past Surgical History:  Procedure Laterality Date   KNEE SURGERY  2019    Social History   Socioeconomic History   Marital status: Married    Spouse name: Not on file   Number of children: Not on file   Years of education: Not on file   Highest education level: Not on file  Occupational History   Not on file  Tobacco Use   Smoking status: Never   Smokeless tobacco: Never  Vaping Use   Vaping Use: Never used  Substance and Sexual Activity   Alcohol use: Not Currently   Drug use: Never   Sexual activity: Yes  Other Topics Concern   Not on file  Social History Narrative   Not on file   Social Determinants of Health   Financial Resource Strain: Not on file  Food  Insecurity: Not on file  Transportation Needs: Not on file  Physical Activity: Not on file  Stress: Not on file  Social Connections: Not on file  Intimate Partner Violence: Not on file    Current Outpatient Medications on File Prior to Visit  Medication Sig Dispense Refill   cephALEXin (KEFLEX) 500 MG capsule Take 1 capsule (500 mg total) by mouth 2 (two) times daily for 7 days. 14 capsule 0   Doxylamine-Pyridoxine 10-10 MG TBEC Take 1 tablet by mouth 4 (four) times daily. Day 1 &2: 2 tablet at bedtimeDay 3 : if symptoms persists 1 tablet am; 2 tablet at bedtimeDay 4: 1 tablet am, 1 tab afternoon, 2 tab at bedtime 120 tablet 5   Prenatal Vit-Fe Fumarate-FA (PRENATAL MULTIVITAMIN) TABS tablet Take 1 tablet by mouth daily at 12 noon.     No current facility-administered medications on file prior to visit.    No Known Allergies   Review of Systems General: Not Present- Fever, Weight Loss and Weight Gain. Skin: Not Present- Rash. HEENT: Not Present- Blurred Vision, Headache and Bleeding Gums. Respiratory: Not Present- Difficulty Breathing. Breast: Not Present- Breast Mass. Cardiovascular: Not Present- Chest Pain, Elevated Blood Pressure, Fainting / Blacking Out and Shortness of Breath. Gastrointestinal: Not Present- Abdominal Pain, Constipation, Nausea and Vomiting. Female Genitourinary: Not Present-  Frequency, Painful Urination, Pelvic Pain, Vaginal Bleeding, Vaginal Discharge, Contractions, regular, Fetal Movements Decreased, Urinary Complaints and Vaginal Fluid. Musculoskeletal: Not Present- Back Pain and Leg Cramps. Neurological: Not Present- Dizziness. Psychiatric: Not Present- Depression.     Objective:   Last menstrual period 08/26/2020.  There is no height or weight on file to calculate BMI.  General Appearance:    Alert, cooperative, no distress, appears stated age  Head:    Normocephalic, without obvious abnormality, atraumatic  Eyes:    PERRL, conjunctiva/corneas clear,  EOM's intact, both eyes  Ears:    Normal external ear canals, both ears  Nose:   Nares normal, septum midline, mucosa normal, no drainage or sinus tenderness  Throat:   Lips, mucosa, and tongue normal; teeth and gums normal  Neck:   Supple, symmetrical, trachea midline, no adenopathy; thyroid: no enlargement/tenderness/nodules; no carotid bruit or JVD  Back:     Symmetric, no curvature, ROM normal, no CVA tenderness  Lungs:     Clear to auscultation bilaterally, respirations unlabored  Chest Wall:    No tenderness or deformity   Heart:    Regular rate and rhythm, S1 and S2 normal, no murmur, rub or gallop  Breast Exam:    No tenderness, masses, or nipple abnormality  Abdomen:     Soft, non-tender, bowel sounds active all four quadrants, no masses, no organomegaly.  FHT ***  bpm.  Genitalia:    Pelvic:external genitalia normal, vagina without lesions, discharge, or tenderness, rectovaginal septum  normal. Cervix normal in appearance, no cervical motion tenderness, no adnexal masses or tenderness.  Pregnancy positive findings: uterine enlargement: *** wk size, nontender.   Rectal:    Normal external sphincter.  No hemorrhoids appreciated. Internal exam not done.   Extremities:   Extremities normal, atraumatic, no cyanosis or edema  Pulses:   2+ and symmetric all extremities  Skin:   Skin color, texture, turgor normal, no rashes or lesions  Lymph nodes:   Cervical, supraclavicular, and axillary nodes normal  Neurologic:   CNII-XII intact, normal strength, sensation and reflexes throughout     Assessment:   1. Initial obstetric visit, second trimester   2. Encounter for supervision of normal first pregnancy in second trimester   3. [redacted] weeks gestation of pregnancy     Plan:   Supervision of normal pregnancy  - Initial labs reviewed. - Prenatal vitamins encouraged. - Problem list reviewed and updated. - New OB counseling:  The patient has been given an overview regarding routine prenatal  care.  Recommendations regarding diet, weight gain, and exercise in pregnancy were given. - Prenatal testing, optional genetic testing, and ultrasound use in pregnancy were reviewed.  Traditional genetic screening vs cell-fee DNA genetic screening discussed, including risks and benefits. Testing {requests/ordered/declines:14581}. - Benefits of Breast Feeding were discussed. The patient is encouraged to consider nursing her baby post partum.  1. Initial obstetric visit, second trimester ***  2. Encounter for supervision of normal first pregnancy in second trimester ***  3. [redacted] weeks gestation of pregnancy ***     Follow up in 4 weeks.    Edwyna Shell, LPN [facility]

## 2020-12-30 ENCOUNTER — Encounter: Payer: Self-pay | Admitting: Obstetrics and Gynecology

## 2020-12-30 DIAGNOSIS — Z3A18 18 weeks gestation of pregnancy: Secondary | ICD-10-CM

## 2020-12-30 DIAGNOSIS — Z3402 Encounter for supervision of normal first pregnancy, second trimester: Secondary | ICD-10-CM

## 2020-12-30 DIAGNOSIS — O0932 Supervision of pregnancy with insufficient antenatal care, second trimester: Secondary | ICD-10-CM

## 2021-01-02 ENCOUNTER — Encounter: Payer: Self-pay | Admitting: Obstetrics and Gynecology

## 2021-01-03 NOTE — Progress Notes (Deleted)
OBSTETRIC INITIAL PRENATAL VISIT  Subjective:    Stacey Sanford is being seen today for her first obstetrical visit.  This {is/is not:9024} a planned pregnancy. She is a 25 y.o. G1P0 female at 58w4dgestation, Estimated Date of Delivery: 06/02/21 with Patient's last menstrual period was 08/26/2020 (exact date).,  consistent with *** week sono. Her obstetrical history is significant for {ob risk factors:10154}. Relationship with FOB: {fob:16621}. Patient {does/does not:19097} intend to breast feed. Pregnancy history fully reviewed.    OB History  Gravida Para Term Preterm AB Living  1 0 0 0 0 0  SAB IAB Ectopic Multiple Live Births  0 0 0 0 0    # Outcome Date GA Lbr Len/2nd Weight Sex Delivery Anes PTL Lv  1 Current             Gynecologic History:  Last pap smear was never completed.  Results were {Normal/Abnormal:304960160}. {Reports/denies:60888} h/o abnormal pap smears in the past.  {Reports/denies:60888} history of STIs.  Contraception prior to conception:    Past Medical History:  Diagnosis Date   No pertinent past medical history     Family History  Problem Relation Age of Onset   Healthy Mother    Healthy Maternal Grandmother    Healthy Paternal Grandmother    Healthy Paternal Grandfather     Past Surgical History:  Procedure Laterality Date   KNEE SURGERY  2019    Social History   Socioeconomic History   Marital status: Married    Spouse name: Not on file   Number of children: Not on file   Years of education: Not on file   Highest education level: Not on file  Occupational History   Not on file  Tobacco Use   Smoking status: Never   Smokeless tobacco: Never  Vaping Use   Vaping Use: Never used  Substance and Sexual Activity   Alcohol use: Not Currently   Drug use: Never   Sexual activity: Yes  Other Topics Concern   Not on file  Social History Narrative   Not on file   Social Determinants of Health   Financial Resource Strain: Not on  file  Food Insecurity: Not on file  Transportation Needs: Not on file  Physical Activity: Not on file  Stress: Not on file  Social Connections: Not on file  Intimate Partner Violence: Not on file    Current Outpatient Medications on File Prior to Visit  Medication Sig Dispense Refill   cephALEXin (KEFLEX) 500 MG capsule Take 1 capsule (500 mg total) by mouth 2 (two) times daily for 7 days. 14 capsule 0   Doxylamine-Pyridoxine 10-10 MG TBEC Take 1 tablet by mouth 4 (four) times daily. Day 1 &2: 2 tablet at bedtimeDay 3 : if symptoms persists 1 tablet am; 2 tablet at bedtimeDay 4: 1 tablet am, 1 tab afternoon, 2 tab at bedtime 120 tablet 5   Prenatal Vit-Fe Fumarate-FA (PRENATAL MULTIVITAMIN) TABS tablet Take 1 tablet by mouth daily at 12 noon.     No current facility-administered medications on file prior to visit.    No Known Allergies   Review of Systems General: Not Present- Fever, Weight Loss and Weight Gain. Skin: Not Present- Rash. HEENT: Not Present- Blurred Vision, Headache and Bleeding Gums. Respiratory: Not Present- Difficulty Breathing. Breast: Not Present- Breast Mass. Cardiovascular: Not Present- Chest Pain, Elevated Blood Pressure, Fainting / Blacking Out and Shortness of Breath. Gastrointestinal: Not Present- Abdominal Pain, Constipation, Nausea and Vomiting. Female Genitourinary: Not Present-  Frequency, Painful Urination, Pelvic Pain, Vaginal Bleeding, Vaginal Discharge, Contractions, regular, Fetal Movements Decreased, Urinary Complaints and Vaginal Fluid. Musculoskeletal: Not Present- Back Pain and Leg Cramps. Neurological: Not Present- Dizziness. Psychiatric: Not Present- Depression.     Objective:   Last menstrual period 08/26/2020.  There is no height or weight on file to calculate BMI.  General Appearance:    Alert, cooperative, no distress, appears stated age  Head:    Normocephalic, without obvious abnormality, atraumatic  Eyes:    PERRL,  conjunctiva/corneas clear, EOM's intact, both eyes  Ears:    Normal external ear canals, both ears  Nose:   Nares normal, septum midline, mucosa normal, no drainage or sinus tenderness  Throat:   Lips, mucosa, and tongue normal; teeth and gums normal  Neck:   Supple, symmetrical, trachea midline, no adenopathy; thyroid: no enlargement/tenderness/nodules; no carotid bruit or JVD  Back:     Symmetric, no curvature, ROM normal, no CVA tenderness  Lungs:     Clear to auscultation bilaterally, respirations unlabored  Chest Wall:    No tenderness or deformity   Heart:    Regular rate and rhythm, S1 and S2 normal, no murmur, rub or gallop  Breast Exam:    No tenderness, masses, or nipple abnormality  Abdomen:     Soft, non-tender, bowel sounds active all four quadrants, no masses, no organomegaly.  FHT ***  bpm.  Genitalia:    Pelvic:external genitalia normal, vagina without lesions, discharge, or tenderness, rectovaginal septum  normal. Cervix normal in appearance, no cervical motion tenderness, no adnexal masses or tenderness.  Pregnancy positive findings: uterine enlargement: *** wk size, nontender.   Rectal:    Normal external sphincter.  No hemorrhoids appreciated. Internal exam not done.   Extremities:   Extremities normal, atraumatic, no cyanosis or edema  Pulses:   2+ and symmetric all extremities  Skin:   Skin color, texture, turgor normal, no rashes or lesions  Lymph nodes:   Cervical, supraclavicular, and axillary nodes normal  Neurologic:   CNII-XII intact, normal strength, sensation and reflexes throughout     Assessment:   1. Encounter for supervision of normal first pregnancy in second trimester   2. [redacted] weeks gestation of pregnancy     Plan:   Supervision of ***normal/high risk pregnancy  - Initial labs reviewed. - Prenatal vitamins encouraged. - Problem list reviewed and updated. - New OB counseling:  The patient has been given an overview regarding routine prenatal care.   Recommendations regarding diet, weight gain, and exercise in pregnancy were given. - Prenatal testing, optional genetic testing, and ultrasound use in pregnancy were reviewed.  Traditional genetic screening vs cell-fee DNA genetic screening discussed, including risks and benefits. Testing {requests/ordered/declines:14581}. - Benefits of Breast Feeding were discussed. The patient is encouraged to consider nursing her baby post partum.  1. Encounter for supervision of normal first pregnancy in second trimester ***  2. [redacted] weeks gestation of pregnancy ***     Follow up in 4 weeks.    Edwyna Shell, LPN [facility]

## 2021-01-04 ENCOUNTER — Encounter: Payer: Managed Care, Other (non HMO) | Admitting: Obstetrics and Gynecology

## 2021-01-04 DIAGNOSIS — Z3A18 18 weeks gestation of pregnancy: Secondary | ICD-10-CM

## 2021-01-04 DIAGNOSIS — Z3402 Encounter for supervision of normal first pregnancy, second trimester: Secondary | ICD-10-CM

## 2021-01-11 ENCOUNTER — Telehealth: Payer: Self-pay | Admitting: Obstetrics and Gynecology

## 2021-01-11 NOTE — Telephone Encounter (Signed)
Notified patient that everything will be ordered at the appointment with the provider. Patient verbalized understanding.

## 2021-01-11 NOTE — Telephone Encounter (Signed)
Stacey Sanford called in to schedule her appointment.  Patient has no showed/cancelled several appointments and has not been seen by a provider outside of being confirmed for pregnancy.  Patient is inquiring about having an anatomy scan since she is [redacted] weeks along and wants to know the sex of her child.  Please advise.

## 2021-01-24 ENCOUNTER — Ambulatory Visit (INDEPENDENT_AMBULATORY_CARE_PROVIDER_SITE_OTHER): Payer: Managed Care, Other (non HMO) | Admitting: Obstetrics and Gynecology

## 2021-01-24 ENCOUNTER — Other Ambulatory Visit: Payer: Self-pay

## 2021-01-24 ENCOUNTER — Encounter: Payer: Self-pay | Admitting: Obstetrics and Gynecology

## 2021-01-24 VITALS — BP 112/73 | HR 81 | Wt 197.3 lb

## 2021-01-24 DIAGNOSIS — Z3402 Encounter for supervision of normal first pregnancy, second trimester: Secondary | ICD-10-CM | POA: Diagnosis not present

## 2021-01-24 DIAGNOSIS — O0932 Supervision of pregnancy with insufficient antenatal care, second trimester: Secondary | ICD-10-CM

## 2021-01-24 DIAGNOSIS — Z3A21 21 weeks gestation of pregnancy: Secondary | ICD-10-CM | POA: Diagnosis not present

## 2021-01-24 DIAGNOSIS — O99212 Obesity complicating pregnancy, second trimester: Secondary | ICD-10-CM

## 2021-01-24 DIAGNOSIS — O99210 Obesity complicating pregnancy, unspecified trimester: Secondary | ICD-10-CM

## 2021-01-24 DIAGNOSIS — Z113 Encounter for screening for infections with a predominantly sexual mode of transmission: Secondary | ICD-10-CM | POA: Diagnosis not present

## 2021-01-24 NOTE — Progress Notes (Signed)
ROB: States she is tired, which is normal. She has no new concerns.

## 2021-01-24 NOTE — Progress Notes (Signed)
NOB: Patient presents today for essentially her first visit of prenatal care.  She is 21 weeks by ultrasound.  She says that "her work" has been keeping her from coming to her prenatal visits.  Desires MaterniT 21.  Desires AFP.  Ultrasound anatomy scheduled.  First trimester comprehensive blood work ordered.  She is feeling the baby move daily.  She is taking prenatal vitamins.  She denies problems. States that she had a Pap smear last year and was normal.  Physical examination General NAD, Conversant  HEENT Atraumatic; Op clear with mmm.  Normo-cephalic. Pupils reactive. Anicteric sclerae  Thyroid/Neck Smooth without nodularity or enlargement. Normal ROM.  Neck Supple.  Skin No rashes, lesions or ulceration. Normal palpated skin turgor. No nodularity.  Breasts: No masses or discharge.  Symmetric.  No axillary adenopathy.  Lungs: Clear to auscultation.No rales or wheezes. Normal Respiratory effort, no retractions.  Heart: NSR.  No murmurs or rubs appreciated. No periferal edema  Abdomen: Soft.  Non-tender.  No masses.  No HSM. No hernia  Extremities: Moves all appropriately.  Normal ROM for age. No lymphadenopathy.  Neuro: Oriented to PPT.  Normal mood. Normal affect.     Pelvic:   Vulva: Normal appearance.  No lesions.  Vagina: No lesions or abnormalities noted.  Support: Normal pelvic support.  Urethra No masses tenderness or scarring.  Meatus Normal size without lesions or prolapse.  Cervix: Normal appearance.  No lesions.  Anus: Normal exam.  No lesions.  Perineum: Normal exam.  No lesions.        Bimanual   Adnexae: No masses.  Non-tender to palpation.  Uterus: Enlarged. POS FHTs  Non-tender.  Mobile.  AV.  Adnexae: No masses.  Non-tender to palpation.  Cul-de-sac: Negative for abnormality.  Adnexae: No masses.  Non-tender to palpation.         Pelvimetry   Diagonal: Reached.  Spines: Average.  Sacrum: Concave.  Pubic Arch: Normal.

## 2021-01-26 LAB — CBC WITH DIFFERENTIAL/PLATELET
Basophils Absolute: 0 10*3/uL (ref 0.0–0.2)
Basos: 0 %
EOS (ABSOLUTE): 0.2 10*3/uL (ref 0.0–0.4)
Eos: 2 %
Hematocrit: 29.7 % — ABNORMAL LOW (ref 34.0–46.6)
Hemoglobin: 9.8 g/dL — ABNORMAL LOW (ref 11.1–15.9)
Immature Grans (Abs): 0 10*3/uL (ref 0.0–0.1)
Immature Granulocytes: 0 %
Lymphocytes Absolute: 2.3 10*3/uL (ref 0.7–3.1)
Lymphs: 29 %
MCH: 25.9 pg — ABNORMAL LOW (ref 26.6–33.0)
MCHC: 33 g/dL (ref 31.5–35.7)
MCV: 79 fL (ref 79–97)
Monocytes Absolute: 0.8 10*3/uL (ref 0.1–0.9)
Monocytes: 10 %
Neutrophils Absolute: 4.8 10*3/uL (ref 1.4–7.0)
Neutrophils: 59 %
Platelets: 285 10*3/uL (ref 150–450)
RBC: 3.78 x10E6/uL (ref 3.77–5.28)
RDW: 13.1 % (ref 11.7–15.4)
WBC: 8.1 10*3/uL (ref 3.4–10.8)

## 2021-01-26 LAB — VIRAL HEPATITIS HBV, HCV
HCV Ab: <0.1 s/co ratio (ref 0.0–0.9)
Hep B Core Total Ab: NEGATIVE
Hep B Surface Ab, Qual: REACTIVE
Hepatitis B Surface Ag: NEGATIVE

## 2021-01-26 LAB — ABO AND RH: Rh Factor: POSITIVE

## 2021-01-26 LAB — VARICELLA ZOSTER ANTIBODY, IGG: Varicella zoster IgG: <135 index — ABNORMAL LOW (ref >165)

## 2021-01-26 LAB — AFP, SERUM, OPEN SPINA BIFIDA
AFP MoM: 0.87
AFP Value: 60 ng/mL
Gest. Age on Collection Date: 21.6 weeks
Maternal Age At EDD: 26.2 yr
OSBR Risk 1 IN: 10000
Test Results:: NEGATIVE
Weight: 197 [lb_av]

## 2021-01-26 LAB — THYROID PANEL WITH TSH
Free Thyroxine Index: 1.1 — ABNORMAL LOW (ref 1.2–4.9)
T3 Uptake Ratio: 11 % — ABNORMAL LOW (ref 24–39)
T4, Total: 10.3 ug/dL (ref 4.5–12.0)
TSH: 0.917 u[IU]/mL (ref 0.450–4.500)

## 2021-01-26 LAB — PARVOVIRUS B19 ANTIBODY, IGG AND IGM
Parvovirus B19 IgG: 0.5 index (ref 0.0–0.8)
Parvovirus B19 IgM: 0.3 index (ref 0.0–0.8)

## 2021-01-26 LAB — RUBELLA SCREEN: Rubella Antibodies, IGG: <0.9 index — ABNORMAL LOW (ref >0.99)

## 2021-01-26 LAB — HEMOGLOBIN A1C
Est. average glucose Bld gHb Est-mCnc: 105 mg/dL
Hgb A1c MFr Bld: 5.3 % (ref 4.8–5.6)

## 2021-01-26 LAB — HGB SOLU + RFLX FRAC: Sickle Solubility Test - HGBRFX: NEGATIVE

## 2021-01-26 LAB — ANTIBODY SCREEN: Antibody Screen: NEGATIVE

## 2021-01-26 LAB — HCV INTERPRETATION

## 2021-01-26 LAB — RPR: RPR Ser Ql: NONREACTIVE

## 2021-01-26 LAB — HIV ANTIBODY (ROUTINE TESTING W REFLEX): HIV Screen 4th Generation wRfx: NONREACTIVE

## 2021-01-27 ENCOUNTER — Telehealth: Payer: Self-pay | Admitting: Obstetrics and Gynecology

## 2021-01-27 NOTE — Telephone Encounter (Signed)
Pt called stated that she had a missed call and was asking about a sooner [place] apt- I made her aware that her [place] is scheduled for 8-31 and that was probably the soonest they can get her in- reassured pt that [facility] is booking gout until middle of September at  the moment. Please Advise about call back.

## 2021-01-27 NOTE — Telephone Encounter (Signed)
Called patient to give her lab results. 

## 2021-01-29 LAB — MATERNIT21  PLUS CORE+ESS+SCA, BLOOD

## 2021-02-08 ENCOUNTER — Ambulatory Visit
Admission: RE | Admit: 2021-02-08 | Discharge: 2021-02-08 | Disposition: A | Discharge status: Home / Self Care | Payer: Managed Care, Other (non HMO) | Source: Ambulatory Visit | Attending: Obstetrics and Gynecology | Admitting: Obstetrics and Gynecology

## 2021-02-08 ENCOUNTER — Other Ambulatory Visit: Payer: Self-pay

## 2021-02-08 DIAGNOSIS — D259 Leiomyoma of uterus, unspecified: Secondary | ICD-10-CM | POA: Insufficient documentation

## 2021-02-08 DIAGNOSIS — Z3A21 21 weeks gestation of pregnancy: Secondary | ICD-10-CM

## 2021-02-08 DIAGNOSIS — O3412 Maternal care for benign tumor of corpus uteri, second trimester: Secondary | ICD-10-CM | POA: Insufficient documentation

## 2021-02-08 DIAGNOSIS — Z3689 Encounter for other specified antenatal screening: Secondary | ICD-10-CM | POA: Insufficient documentation

## 2021-02-08 DIAGNOSIS — Z3A23 23 weeks gestation of pregnancy: Secondary | ICD-10-CM | POA: Diagnosis not present

## 2021-02-16 ENCOUNTER — Telehealth: Payer: Self-pay | Admitting: Obstetrics and Gynecology

## 2021-02-16 NOTE — Telephone Encounter (Signed)
Patient states that she is sick in the mornings and typically she will get better within 30-45 minutes.  She stated that it is causing her to be late for work.  Her work is requesting that she get a letter from the doctor so that her being late can be excused.

## 2021-02-17 NOTE — Telephone Encounter (Signed)
Called patient and discussed nausea vomiting protocols with her. She verbalized understanding and will wait until her next appointment to discuss work note.

## 2021-02-22 ENCOUNTER — Other Ambulatory Visit: Payer: Self-pay

## 2021-02-22 ENCOUNTER — Encounter: Payer: Self-pay | Admitting: Obstetrics and Gynecology

## 2021-02-22 ENCOUNTER — Ambulatory Visit (INDEPENDENT_AMBULATORY_CARE_PROVIDER_SITE_OTHER): Payer: Managed Care, Other (non HMO) | Admitting: Obstetrics and Gynecology

## 2021-02-22 VITALS — BP 109/69 | HR 87 | Wt 198.6 lb

## 2021-02-22 DIAGNOSIS — D259 Leiomyoma of uterus, unspecified: Secondary | ICD-10-CM

## 2021-02-22 DIAGNOSIS — O0932 Supervision of pregnancy with insufficient antenatal care, second trimester: Secondary | ICD-10-CM | POA: Insufficient documentation

## 2021-02-22 DIAGNOSIS — O3410 Maternal care for benign tumor of corpus uteri, unspecified trimester: Secondary | ICD-10-CM

## 2021-02-22 DIAGNOSIS — Z3A25 25 weeks gestation of pregnancy: Secondary | ICD-10-CM

## 2021-02-22 DIAGNOSIS — Z3402 Encounter for supervision of normal first pregnancy, second trimester: Secondary | ICD-10-CM

## 2021-02-22 LAB — POCT URINALYSIS DIPSTICK OB
Bilirubin, UA: NEGATIVE
Blood, UA: NEGATIVE
Glucose, UA: NEGATIVE
Ketones, UA: NEGATIVE
Nitrite, UA: NEGATIVE
POC,PROTEIN,UA: NEGATIVE
Spec Grav, UA: 1.01
Urobilinogen, UA: 0.2 U/dL
pH, UA: 6.5

## 2021-02-22 NOTE — Progress Notes (Signed)
ROB: Complains of mild left side pain. Discussed home comfort measures. Declines flu vaccine until next visit. For 28 week labs then. Desires to formula feed. Declines breastfeeding info at this time. RTC in 4 weeks, will also get f/u ultrasound for uterine fibroids in pregnancy (last was 7.5 cm on anatomy scan).

## 2021-02-22 NOTE — Patient Instructions (Addendum)
 1-Hour Glucose  No dessert the night before No sweet drinks the day of- soda, fruit juice, sweet tea No sweet breakfast- pancakes, donuts May have mostly protein- egg, bacon, wheat toast, black coffee.               Grilled chicken, salad, vegetable, water.       3-Hour Glucose Test  Must be fasting.  Nothing to eat or drink after midnight.  May have morning medication with a sip of water.     Common Medications Safe in Pregnancy  Acne:      Constipation:  Benzoyl Peroxide     Colace  Clindamycin      Dulcolax Suppository  Topica Erythromycin     Fibercon  Salicylic Acid      Metamucil         Miralax AVOID:        Senakot   Accutane    Cough:  Retin-A       Cough Drops  Tetracycline      Phenergan w/ Codeine if Rx  Minocycline      Robitussin (Plain & DM)  Antibiotics:     Crabs/Lice:  Ceclor       RID  Cephalosporins    AVOID:  E-Mycins      Kwell  Keflex  Macrobid/Macrodantin   Diarrhea:  Penicillin      Kao-Pectate  Zithromax      Imodium AD         PUSH FLUIDS AVOID:       Cipro     Fever:  Tetracycline      Tylenol (Regular or Extra  Minocycline       Strength)  Levaquin      Extra Strength-Do not          Exceed 8 tabs/24 hrs Caffeine:        <200mg/day (equiv. To 1 cup of coffee or  approx. 3 12 oz sodas)         Gas: Cold/Hayfever:       Gas-X  Benadryl      Mylicon  Claritin       Phazyme  **Claritin-D        Chlor-Trimeton    Headaches:  Dimetapp      ASA-Free Excedrin  Drixoral-Non-Drowsy     Cold Compress  Mucinex (Guaifenasin)     Tylenol (Regular or Extra  Sudafed/Sudafed-12 Hour     Strength)  **Sudafed PE Pseudoephedrine   Tylenol Cold & Sinus     Vicks Vapor Rub  Zyrtec  **AVOID if Problems With Blood Pressure         Heartburn: Avoid lying down for at least 1 hour after meals  Aciphex      Maalox     Rash:  Milk of Magnesia     Benadryl    Mylanta       1% Hydrocortisone Cream  Pepcid  Pepcid Complete   Sleep  Aids:  Prevacid      Ambien   Prilosec       Benadryl  Rolaids       Chamomile Tea  Tums (Limit 4/day)     Unisom         Tylenol PM         Warm milk-add vanilla or  Hemorrhoids:       Sugar for taste  Anusol/Anusol H.C.  (RX: Analapram 2.5%)  Sugar Substitutes:  Hydrocortisone OTC     Ok in moderation    Preparation H      Tucks        Vaseline lotion applied to tissue with wiping    Herpes:     Throat:  Acyclovir      Oragel  Famvir  Valtrex     Vaccines:         Flu Shot Leg Cramps:       *Gardasil  Benadryl      Hepatitis A         Hepatitis B Nasal Spray:       Pneumovax  Saline Nasal Spray     Polio Booster         Tetanus Nausea:       Tuberculosis test or PPD  Vitamin B6 25 mg TID   AVOID:    Dramamine      *Gardasil  Emetrol       Live Poliovirus  Ginger Root 250 mg QID    MMR (measles, mumps &  High Complex Carbs @ Bedtime    rebella)  Sea Bands-Accupressure    Varicella (Chickenpox)  Unisom 1/2 tab TID     *No known complications           If received before Pain:         Known pregnancy;   Darvocet       Resume series after  Lortab        Delivery  Percocet    Yeast:   Tramadol      Femstat  Tylenol 3      Gyne-lotrimin  Ultram       Monistat  Vicodin           MISC:         All Sunscreens           Hair Coloring/highlights          Insect Repellant's          (Including DEET)         Mystic Tans    Tdap (Tetanus, Diphtheria, Pertussis) Vaccine: What You Need to Know 1. Why get vaccinated? Tdap vaccine can prevent tetanus, diphtheria, and pertussis. Diphtheria and pertussis spread from person to person. Tetanus enters the body through cuts or wounds. TETANUS (T) causes painful stiffening of the muscles. Tetanus can lead to serious health problems, including being unable to open the mouth, having trouble swallowing and breathing, or death. DIPHTHERIA (D) can lead to difficulty breathing, heart failure, paralysis, or death. PERTUSSIS (aP), also known  as "whooping cough," can cause uncontrollable, violent coughing that makes it hard to breathe, eat, or drink. Pertussis can be extremely serious especially in babies and young children, causing pneumonia, convulsions, brain damage, or death. In teens and adults, it can cause weight loss, loss of bladder control, passing out, and rib fractures from severe coughing. 2. Tdap vaccine Tdap is only for children 7 years and older, adolescents, and adults.  Adolescents should receive a single dose of Tdap, preferably at age 42 or 63 years. Pregnant people should get a dose of Tdap during every pregnancy, preferably during the early part of the third trimester, to help protect the newborn from pertussis. Infants are most at risk for severe, life-threatening complications from pertussis. Adults who have never received Tdap should get a dose of Tdap. Also, adults should receive a booster dose of either Tdap or Td (a different vaccine that protects against tetanus and diphtheria but not pertussis) every 10 years, or after 5 years in the  case of a severe or dirty wound or burn. Tdap may be given at the same time as other vaccines. 3. Talk with your health care provider Tell your vaccine provider if the person getting the vaccine: Has had an allergic reaction after a previous dose of any vaccine that protects against tetanus, diphtheria, or pertussis, or has any severe, life-threatening allergies Has had a coma, decreased level of consciousness, or prolonged seizures within 7 days after a previous dose of any pertussis vaccine (DTP, DTaP, or Tdap) Has seizures or another nervous system problem Has ever had Guillain-Barr Syndrome (also called "GBS") Has had severe pain or swelling after a previous dose of any vaccine that protects against tetanus or diphtheria In some cases, your health care provider may decide to postpone Tdap vaccination until a future visit. People with minor illnesses, such as a cold, may be  vaccinated. People who are moderately or severely ill should usually wait until they recover before getting Tdap vaccine.  Your health care provider can give you more information. 4. Risks of a vaccine reaction Pain, redness, or swelling where the shot was given, mild fever, headache, feeling tired, and nausea, vomiting, diarrhea, or stomachache sometimes happen after Tdap vaccination. People sometimes faint after medical procedures, including vaccination. Tell your provider if you feel dizzy or have vision changes or ringing in the ears.  As with any medicine, there is a very remote chance of a vaccine causing a severe allergic reaction, other serious injury, or death. 5. What if there is a serious problem? An allergic reaction could occur after the vaccinated person leaves the clinic. If you see signs of a severe allergic reaction (hives, swelling of the face and throat, difficulty breathing, a fast heartbeat, dizziness, or weakness), call 9-1-1 and get the person to the nearest hospital. For other signs that concern you, call your health care provider.  Adverse reactions should be reported to the Vaccine Adverse Event Reporting System (VAERS). Your health care provider will usually file this report, or you can do it yourself. Visit the VAERS website at www.vaers.SamedayNews.es or call 807 871 8392. VAERS is only for reporting reactions, and VAERS staff members do not give medical advice. 6. The National Vaccine Injury Compensation Program The [employer] Vaccine Injury Compensation Program (VICP) is a federal program that was created to compensate people who may have been injured by certain vaccines. Claims regarding alleged injury or death due to vaccination have a time limit for filing, which may be as short as two years. Visit the VICP website at GoldCloset.com.ee or call 2678645457 to learn about the program and about filing a claim. 7. How can I learn more? Ask your health care  provider. Call your local or state health department. Visit the website of the Food and Drug Administration (FDA) for vaccine package inserts and additional information at TraderRating.uy. Contact the Centers for Disease Control and Prevention (CDC): Call 325-422-7598 (1-800-CDC-INFO) or Visit CDC's website at http://hunter.com/. Vaccine Information Statement Tdap (Tetanus, Diphtheria, Pertussis) Vaccine (01/15/2020) This information is not intended to replace advice given to you by your health care provider. Make sure you discuss any questions you have with your health care provider. Document Revised: 02/10/2020 Document Reviewed: 02/10/2020 Elsevier Patient Education  2022 [employer].

## 2021-02-22 NOTE — Progress Notes (Signed)
OB-Pt present for routine prenatal care. Pt stated fetal movement present; no contractions present; no vaginal bleeding and no changes in vaginal discharge.     Pt c/o left side pain.  Pt declined flu vaccine. 1 hour glucose and next visit information explained and information given to patient.

## 2021-03-20 ENCOUNTER — Telehealth: Payer: Self-pay | Admitting: Obstetrics and Gynecology

## 2021-03-20 NOTE — Telephone Encounter (Signed)
Patient has called again stating that she is about the same.

## 2021-03-20 NOTE — Telephone Encounter (Signed)
Patient states that she is 29 weeks and is having some lower abdominal pain. Pain was described as being very sharpe. She stated that the baby has been very active since really early this morning. Please advise

## 2021-03-22 ENCOUNTER — Other Ambulatory Visit: Payer: Managed Care, Other (non HMO)

## 2021-03-22 ENCOUNTER — Other Ambulatory Visit: Payer: Self-pay

## 2021-03-22 ENCOUNTER — Ambulatory Visit (INDEPENDENT_AMBULATORY_CARE_PROVIDER_SITE_OTHER): Payer: Managed Care, Other (non HMO) | Admitting: Obstetrics and Gynecology

## 2021-03-22 ENCOUNTER — Encounter: Payer: Self-pay | Admitting: Obstetrics and Gynecology

## 2021-03-22 ENCOUNTER — Ambulatory Visit (INDEPENDENT_AMBULATORY_CARE_PROVIDER_SITE_OTHER): Payer: Managed Care, Other (non HMO)

## 2021-03-22 VITALS — BP 118/77 | HR 94 | Wt 206.4 lb

## 2021-03-22 DIAGNOSIS — D259 Leiomyoma of uterus, unspecified: Secondary | ICD-10-CM

## 2021-03-22 DIAGNOSIS — Z3403 Encounter for supervision of normal first pregnancy, third trimester: Secondary | ICD-10-CM

## 2021-03-22 DIAGNOSIS — Z3A29 29 weeks gestation of pregnancy: Secondary | ICD-10-CM

## 2021-03-22 DIAGNOSIS — O3410 Maternal care for benign tumor of corpus uteri, unspecified trimester: Secondary | ICD-10-CM

## 2021-03-22 DIAGNOSIS — Z3402 Encounter for supervision of normal first pregnancy, second trimester: Secondary | ICD-10-CM

## 2021-03-22 LAB — POCT URINALYSIS DIPSTICK OB
Bilirubin, UA: NEGATIVE
Glucose, UA: NEGATIVE
Ketones, UA: NEGATIVE
Nitrite, UA: NEGATIVE
Odor: NEGATIVE
POC,PROTEIN,UA: NEGATIVE
Spec Grav, UA: 1.015 (ref 1.010–1.025)
Urobilinogen, UA: 0.2 E.U./dL
pH, UA: 7 (ref 5.0–8.0)

## 2021-03-22 NOTE — Progress Notes (Signed)
ROB- GTT and f/u u/s. Declines flu and tdap. Sharp pain x 1 several days ago- lasted 30 minutes.

## 2021-03-22 NOTE — Progress Notes (Signed)
ROB: Generally doing well.  GCT today.  Ultrasound follow-up for fibroid today.  Patient declined flu vaccine.  Discussed pain that she experienced 1 week ago-possibly in relation to fibroid.

## 2021-03-23 LAB — GLUCOSE, 1 HOUR GESTATIONAL: Gestational Diabetes Screen: 101 mg/dL (ref 70–139)

## 2021-03-23 LAB — CBC
Hematocrit: 30.4 % — ABNORMAL LOW (ref 34.0–46.6)
Hemoglobin: 9.8 g/dL — ABNORMAL LOW (ref 11.1–15.9)
MCH: 25.9 pg — ABNORMAL LOW (ref 26.6–33.0)
MCHC: 32.2 g/dL (ref 31.5–35.7)
MCV: 80 fL (ref 79–97)
Platelets: 309 10*3/uL (ref 150–450)
RBC: 3.78 x10E6/uL (ref 3.77–5.28)
RDW: 13.2 % (ref 11.7–15.4)
WBC: 8 10*3/uL (ref 3.4–10.8)

## 2021-03-23 LAB — RPR: RPR Ser Ql: NONREACTIVE

## 2021-03-28 ENCOUNTER — Other Ambulatory Visit: Payer: Self-pay | Admitting: Obstetrics and Gynecology

## 2021-03-28 ENCOUNTER — Telehealth: Payer: Self-pay | Admitting: Obstetrics and Gynecology

## 2021-03-28 DIAGNOSIS — O283 Abnormal ultrasonic finding on antenatal screening of mother: Secondary | ICD-10-CM

## 2021-03-28 NOTE — Telephone Encounter (Signed)
Pt called back after missing a call- I made her aware of my chart message response, she asked me to let you know she called.

## 2021-04-13 ENCOUNTER — Encounter: Payer: Self-pay | Admitting: Obstetrics and Gynecology

## 2021-04-13 ENCOUNTER — Other Ambulatory Visit: Payer: Self-pay

## 2021-04-13 ENCOUNTER — Ambulatory Visit (INDEPENDENT_AMBULATORY_CARE_PROVIDER_SITE_OTHER): Payer: Managed Care, Other (non HMO) | Admitting: Obstetrics and Gynecology

## 2021-04-13 VITALS — BP 120/66 | HR 99 | Wt 212.7 lb

## 2021-04-13 DIAGNOSIS — Z3403 Encounter for supervision of normal first pregnancy, third trimester: Secondary | ICD-10-CM

## 2021-04-13 DIAGNOSIS — O3410 Maternal care for benign tumor of corpus uteri, unspecified trimester: Secondary | ICD-10-CM

## 2021-04-13 DIAGNOSIS — D259 Leiomyoma of uterus, unspecified: Secondary | ICD-10-CM

## 2021-04-13 DIAGNOSIS — Z3A32 32 weeks gestation of pregnancy: Secondary | ICD-10-CM

## 2021-04-13 LAB — POCT URINALYSIS DIPSTICK OB
Bilirubin, UA: NEGATIVE
Blood, UA: NEGATIVE
Glucose, UA: NEGATIVE
Ketones, UA: NEGATIVE
Nitrite, UA: NEGATIVE
POC,PROTEIN,UA: NEGATIVE
Spec Grav, UA: 1.01 (ref 1.010–1.025)
Urobilinogen, UA: 0.2 E.U./dL
pH, UA: 6.5 (ref 5.0–8.0)

## 2021-04-13 NOTE — Patient Instructions (Addendum)
[facility], [address], [place] [address]  Phone: [phone]  [facility] (second location)  [address] [place], [place] [address]  Phone: [phone]  [facility]) [employer], [place], [place] [address] Phone: [phone]  [facility]  [address] [place], [place] [address]  Phone: [phone]       Common Medications Safe in Pregnancy  Acne:      Constipation:  Benzoyl Peroxide     Colace  Clindamycin      Dulcolax Suppository  Topica Erythromycin     Fibercon  Salicylic Acid      Metamucil         Miralax AVOID:        Senakot   Accutane    Cough:  Retin-A       Cough Drops  Tetracycline      Phenergan w/ Codeine if Rx  Minocycline      Robitussin (Plain & DM)  Antibiotics:     Crabs/Lice:  Ceclor       RID  Cephalosporins    AVOID:  E-Mycins      Kwell  Keflex  Macrobid/Macrodantin   Diarrhea:  Penicillin      Kao-Pectate  Zithromax      Imodium AD         PUSH FLUIDS AVOID:       Cipro     Fever:  Tetracycline      Tylenol (Regular or Extra  Minocycline       Strength)  Levaquin      Extra Strength-Do not          Exceed 8 tabs/24 hrs Caffeine:        <23m/day (equiv. To 1 cup of coffee or  approx. 3 12 oz sodas)         Gas: Cold/Hayfever:       Gas-X  Benadryl      Mylicon  Claritin       Phazyme  **Claritin-D        Chlor-Trimeton    Headaches:  Dimetapp      ASA-Free Excedrin  Drixoral-Non-Drowsy     Cold Compress  Mucinex (Guaifenasin)     Tylenol (Regular or Extra  Sudafed/Sudafed-12 Hour     Strength)  **Sudafed PE Pseudoephedrine   Tylenol Cold & Sinus     Vicks Vapor Rub  Zyrtec  **AVOID if Problems With Blood Pressure         Heartburn: Avoid lying down for at least 1 hour after meals  Aciphex      Maalox     Rash:  Milk of Magnesia     Benadryl    Mylanta       1% Hydrocortisone Cream  Pepcid  Pepcid  Complete   Sleep Aids:  Prevacid      Ambien   Prilosec       Benadryl  Rolaids       Chamomile Tea  Tums (Limit 4/day)     Unisom         Tylenol PM         Warm milk-add vanilla or  Hemorrhoids:       Sugar for taste  Anusol/Anusol H.C.  (RX: Analapram 2.5%)  Sugar Substitutes:  Hydrocortisone OTC     Ok in moderation  Preparation H      Tucks        Vaseline lotion  applied to tissue with wiping    Herpes:     Throat:  Acyclovir      Oragel  Famvir  Valtrex     Vaccines:         Flu Shot Leg Cramps:       *Gardasil  Benadryl      Hepatitis A         Hepatitis B Nasal Spray:       Pneumovax  Saline Nasal Spray     Polio Booster         Tetanus Nausea:       Tuberculosis test or PPD  Vitamin B6 25 mg TID   AVOID:    Dramamine      *Gardasil  Emetrol       Live Poliovirus  Ginger Root 250 mg QID    MMR (measles, mumps &  High Complex Carbs @ Bedtime    rebella)  Sea Bands-Accupressure    Varicella (Chickenpox)  Unisom 1/2 tab TID     *No known complications           If received before Pain:         Known pregnancy;   Darvocet       Resume series after  Lortab        Delivery  Percocet    Yeast:   Tramadol      Femstat  Tylenol 3      Gyne-lotrimin  Ultram       Monistat  Vicodin           MISC:         All Sunscreens           Hair Coloring/highlights          Insect Repellant's          (Including DEET)         Mystic Tans

## 2021-04-13 NOTE — Progress Notes (Signed)
ROB: Patient noting pelvic pressure. Advised on belly band. Discussed breastfeeding (see topics). Discussed pain management in labor, considering epidural. Declines flu vaccine again today. Desires circumcision for female infant. Given list of local Pediatricians. RTC in 2 weeks. For growth scan and f/u fibroids in 4 weeks.

## 2021-04-13 NOTE — Progress Notes (Signed)
OB-Pt present for routine prenatal care. Pt c/o lower abd pain and pressure.

## 2021-04-18 ENCOUNTER — Other Ambulatory Visit: Payer: Self-pay

## 2021-04-18 DIAGNOSIS — O283 Abnormal ultrasonic finding on antenatal screening of mother: Secondary | ICD-10-CM

## 2021-04-20 ENCOUNTER — Ambulatory Visit: Payer: Managed Care, Other (non HMO) | Admitting: *Deleted

## 2021-04-20 ENCOUNTER — Other Ambulatory Visit: Payer: Self-pay

## 2021-04-20 ENCOUNTER — Ambulatory Visit: Payer: Managed Care, Other (non HMO)

## 2021-04-20 ENCOUNTER — Ambulatory Visit: Payer: Managed Care, Other (non HMO) | Attending: Obstetrics and Gynecology

## 2021-04-20 ENCOUNTER — Encounter: Payer: Self-pay | Admitting: *Deleted

## 2021-04-20 VITALS — BP 114/63 | HR 100

## 2021-04-20 DIAGNOSIS — E669 Obesity, unspecified: Secondary | ICD-10-CM

## 2021-04-20 DIAGNOSIS — O99213 Obesity complicating pregnancy, third trimester: Secondary | ICD-10-CM | POA: Diagnosis not present

## 2021-04-20 DIAGNOSIS — O283 Abnormal ultrasonic finding on antenatal screening of mother: Secondary | ICD-10-CM | POA: Insufficient documentation

## 2021-04-20 DIAGNOSIS — D259 Leiomyoma of uterus, unspecified: Secondary | ICD-10-CM | POA: Diagnosis not present

## 2021-04-20 DIAGNOSIS — Z3689 Encounter for other specified antenatal screening: Secondary | ICD-10-CM | POA: Insufficient documentation

## 2021-04-20 DIAGNOSIS — O3413 Maternal care for benign tumor of corpus uteri, third trimester: Secondary | ICD-10-CM

## 2021-04-20 DIAGNOSIS — Z3A33 33 weeks gestation of pregnancy: Secondary | ICD-10-CM

## 2021-04-25 NOTE — Patient Instructions (Signed)

## 2021-04-26 ENCOUNTER — Other Ambulatory Visit: Payer: Self-pay

## 2021-04-26 ENCOUNTER — Ambulatory Visit (INDEPENDENT_AMBULATORY_CARE_PROVIDER_SITE_OTHER): Payer: Managed Care, Other (non HMO) | Admitting: Obstetrics and Gynecology

## 2021-04-26 ENCOUNTER — Encounter: Payer: Self-pay | Admitting: Obstetrics and Gynecology

## 2021-04-26 VITALS — BP 102/54 | HR 91 | Wt 213.6 lb

## 2021-04-26 DIAGNOSIS — Z3403 Encounter for supervision of normal first pregnancy, third trimester: Secondary | ICD-10-CM

## 2021-04-26 DIAGNOSIS — Z3A34 34 weeks gestation of pregnancy: Secondary | ICD-10-CM

## 2021-04-26 LAB — POCT URINALYSIS DIPSTICK OB
Bilirubin, UA: NEGATIVE
Blood, UA: NEGATIVE
Glucose, UA: NEGATIVE
Ketones, UA: NEGATIVE
Nitrite, UA: NEGATIVE
POC,PROTEIN,UA: NEGATIVE
Spec Grav, UA: 1.01 (ref 1.010–1.025)
Urobilinogen, UA: 0.2 E.U./dL
pH, UA: 8.5 — AB (ref 5.0–8.0)

## 2021-04-26 NOTE — Progress Notes (Signed)
ROB: Reports being "tired" but otherwise well.  Denies contractions.  Possibly [employer].  Reports daily fetal movement.  Taking vitamins as directed.

## 2021-04-26 NOTE — Progress Notes (Signed)
OB-Pt present for routine prenatal care.  Pt c/o lower abd pain and pressure.

## 2021-05-12 ENCOUNTER — Other Ambulatory Visit: Payer: Managed Care, Other (non HMO)

## 2021-05-12 ENCOUNTER — Encounter: Payer: Managed Care, Other (non HMO) | Admitting: Obstetrics and Gynecology

## 2021-05-15 NOTE — Telephone Encounter (Signed)
Patient will need to come in and pay $25 for form completion for ADA forms. She said she will be here today to pay.

## 2021-05-17 ENCOUNTER — Encounter: Payer: Self-pay | Admitting: Obstetrics and Gynecology

## 2021-05-19 ENCOUNTER — Encounter: Payer: Self-pay | Admitting: Obstetrics and Gynecology

## 2021-05-24 ENCOUNTER — Telehealth: Payer: Managed Care, Other (non HMO) | Admitting: Obstetrics and Gynecology

## 2021-05-25 NOTE — Telephone Encounter (Signed)
Pt called to have FMLA forms faxed to a different fax number [phone] and would like to have it faxed by 5:00 today.  Pt would like to have a call back when it has been faxed.

## 2021-05-26 NOTE — Telephone Encounter (Signed)
This was faxed to patient yesterday, before 5.

## 2021-06-15 ENCOUNTER — Encounter: Payer: Managed Care, Other (non HMO) | Admitting: Obstetrics and Gynecology

## 2021-06-20 ENCOUNTER — Telehealth: Payer: Self-pay | Admitting: Obstetrics and Gynecology

## 2021-06-20 NOTE — Telephone Encounter (Signed)
Pt called asking about forms that she submitted via mychart- this paperwork is for pt to received her paycheck. Please Advise.

## 2021-06-23 NOTE — Telephone Encounter (Signed)
I placed forms at front desk for patient to pick up she has to complete section 1 and 2. Please reach back out to patient and explain this.

## 2021-07-10 ENCOUNTER — Telehealth: Payer: Self-pay | Admitting: Obstetrics and Gynecology

## 2021-07-10 NOTE — Telephone Encounter (Signed)
Pt called stating that she was waiting on FMLA forms to be faxed- she said that it is holding up income benefits and states that the people needing the information can call office to get the information if that is possible. I updated pt's contact number as phone that was in chart was deactivated. Please advise.

## 2021-07-11 ENCOUNTER — Telehealth: Payer: Self-pay | Admitting: Obstetrics and Gynecology

## 2021-07-11 NOTE — Telephone Encounter (Signed)
Called and spoke with pt about the [employer] disability forms she wanted emailed to her personal email. Confirmed with pt while on the phone that she did receive the forms.

## 2021-09-13 ENCOUNTER — Encounter: Payer: Managed Care, Other (non HMO) | Admitting: Obstetrics and Gynecology

## 2021-09-13 NOTE — Progress Notes (Deleted)
? ? ?GYNECOLOGY ANNUAL PHYSICAL EXAM PROGRESS NOTE ? ?Subjective:  ? ? Stacey Sanford is a 26 y.o. G1P0 female who presents for an annual exam. The patient has no complaints today. The patient {is/is not/has never been:13135} sexually active. The patient participates in regular exercise: {yes/no/not asked:9010}. Has the patient ever been transfused or tattooed?: {yes/no/not asked:9010}. The patient reports that there {is/is not:9024} domestic violence in her life.  ? ? ?Menstrual History: ?Menarche age: *** ?No LMP recorded. ?  ? ? ?Gynecologic History:  ?Contraception: {method:5051} ?History of STI's:  ?Last Pap: ***. Results were: {norm/abn:16337}.  ***Denies/Notes h/o abnormal pap smears. ?Last mammogram: ***. Results were: {norm/abn:16337} ? ? ? ? ? ? ?OB History  ?Gravida Para Term Preterm AB Living  ?1 0 0 0 0 0  ?SAB IAB Ectopic Multiple Live Births  ?0 0 0 0 0  ?  ?# Outcome Date GA Lbr Len/2nd Weight Sex Delivery Anes PTL Lv  ?1 Gravida           ? ? ?Past Medical History:  ?Diagnosis Date  ? No pertinent past medical history   ? ? ?Past Surgical History:  ?Procedure Laterality Date  ? KNEE SURGERY  2019  ? ? ?Family History  ?Problem Relation Age of Onset  ? Healthy Mother   ? Healthy Maternal Grandmother   ? Healthy Paternal Grandmother   ? Healthy Paternal Grandfather   ? ? ?Social History  ? ?Socioeconomic History  ? Marital status: Married  ?  Spouse name: Not on file  ? Number of children: Not on file  ? Years of education: Not on file  ? Highest education level: Not on file  ?Occupational History  ? Not on file  ?Tobacco Use  ? Smoking status: Never  ? Smokeless tobacco: Never  ?Vaping Use  ? Vaping Use: Never used  ?Substance and Sexual Activity  ? Alcohol use: Not Currently  ? Drug use: Never  ? Sexual activity: Yes  ?Other Topics Concern  ? Not on file  ?Social History Narrative  ? Not on file  ? ?Social Determinants of Health  ? ?Financial Resource Strain: Not on file  ?Food Insecurity: Not on  file  ?Transportation Needs: Not on file  ?Physical Activity: Not on file  ?Stress: Not on file  ?Social Connections: Not on file  ?Intimate Partner Violence: Not on file  ? ? ?Current Outpatient Medications on File Prior to Visit  ?Medication Sig Dispense Refill  ? Doxylamine-Pyridoxine 10-10 MG TBEC Take 1 tablet by mouth 4 (four) times daily. Day 1 &2: 2 tablet at bedtimeDay 3 : if symptoms persists 1 tablet am; 2 tablet at bedtimeDay 4: 1 tablet am, 1 tab afternoon, 2 tab at bedtime 120 tablet 5  ? Prenatal Vit-Fe Fumarate-FA (PRENATAL MULTIVITAMIN) TABS tablet Take 1 tablet by mouth daily at 12 noon.    ? ?No current facility-administered medications on file prior to visit.  ? ? ?No Known Allergies ? ? ?Review of Systems ?Constitutional: negative for chills, fatigue, fevers and sweats ?Eyes: negative for irritation, redness and visual disturbance ?Ears, nose, mouth, throat, and face: negative for hearing loss, nasal congestion, snoring and tinnitus ?Respiratory: negative for asthma, cough, sputum ?Cardiovascular: negative for chest pain, dyspnea, exertional chest pressure/discomfort, irregular heart beat, palpitations and syncope ?Gastrointestinal: negative for abdominal pain, change in bowel habits, nausea and vomiting ?Genitourinary: negative for abnormal menstrual periods, genital lesions, sexual problems and vaginal discharge, dysuria and urinary incontinence ?Integument/breast: negative for breast lump,  breast tenderness and nipple discharge ?Hematologic/lymphatic: negative for bleeding and easy bruising ?Musculoskeletal:negative for back pain and muscle weakness ?Neurological: negative for dizziness, headaches, vertigo and weakness ?Endocrine: negative for diabetic symptoms including polydipsia, polyuria and skin dryness ?Allergic/Immunologic: negative for hay fever and urticaria    ? ? ?Objective:  ?unknown if currently breastfeeding. There is no height or weight on file to calculate BMI. ? ?  ?General  Appearance:    Alert, cooperative, no distress, appears stated age  ?Head:    Normocephalic, without obvious abnormality, atraumatic  ?Eyes:    PERRL, conjunctiva/corneas clear, EOM's intact, both eyes  ?Ears:    Normal external ear canals, both ears  ?Nose:   Nares normal, septum midline, mucosa normal, no drainage or sinus tenderness  ?Throat:   Lips, mucosa, and tongue normal; teeth and gums normal  ?Neck:   Supple, symmetrical, trachea midline, no adenopathy; thyroid: no enlargement/tenderness/nodules; no carotid bruit or JVD  ?Back:     Symmetric, no curvature, ROM normal, no CVA tenderness  ?Lungs:     Clear to auscultation bilaterally, respirations unlabored  ?Chest Wall:    No tenderness or deformity  ? Heart:    Regular rate and rhythm, S1 and S2 normal, no murmur, rub or gallop  ?Breast Exam:    No tenderness, masses, or nipple abnormality  ?Abdomen:     Soft, non-tender, bowel sounds active all four quadrants, no masses, no organomegaly.    ?Genitalia:    Pelvic:external genitalia normal, vagina without lesions, discharge, or tenderness, rectovaginal septum  normal. Cervix normal in appearance, no cervical motion tenderness, no adnexal masses or tenderness.  Uterus normal size, shape, mobile, regular contours, nontender.  ?Rectal:    Normal external sphincter.  No hemorrhoids appreciated. Internal exam not done.   ?Extremities:   Extremities normal, atraumatic, no cyanosis or edema  ?Pulses:   2+ and symmetric all extremities  ?Skin:   Skin color, texture, turgor normal, no rashes or lesions  ?Lymph nodes:   Cervical, supraclavicular, and axillary nodes normal  ?Neurologic:   CNII-XII intact, normal strength, sensation and reflexes throughout  ? ?. ? ?Labs:  ?Lab Results  ?Component Value Date  ? WBC 8.0 03/22/2021  ? HGB 9.8 (L) 03/22/2021  ? HCT 30.4 (L) 03/22/2021  ? MCV 80 03/22/2021  ? PLT 309 03/22/2021  ? ? ?Lab Results  ?Component Value Date  ? CREATININE 0.56 12/27/2020  ? BUN 6 12/27/2020  ? NA  134 (L) 12/27/2020  ? K 3.6 12/27/2020  ? CL 100 12/27/2020  ? CO2 26 12/27/2020  ? ? ?Lab Results  ?Component Value Date  ? ALT 47 (H) 12/27/2020  ? AST 34 12/27/2020  ? ALKPHOS 55 12/27/2020  ? BILITOT 0.6 12/27/2020  ? ? ?Lab Results  ?Component Value Date  ? TSH 0.917 01/24/2021  ? ? ? ?Assessment:  ? ?No diagnosis found. ?  ?Plan:  ?Blood tests: {blood tests:13147}. ?Breast self exam technique reviewed and patient encouraged to perform self-exam monthly. ?Contraception: {contraceptive methods:5051}. ?Discussed healthy lifestyle modifications. ?Mammogram {discussed/ordered:14545} ?Pap smear {discussed/ordered:14545}. ?COVID vaccination status: ?Follow up in 1 year for annual exam ? ? ?Rubie Maid, MD ?[facility] ? ?

## 2021-12-13 IMAGING — US US OB COMP LESS 14 WK
1 series · 14 of 28 positions shown · non-contrast
Comparison: None.

CLINICAL DATA: Missed menses

EXAM:
OBSTETRIC <14 WK ULTRASOUND
TECHNIQUE: Transabdominal ultrasound was performed for evaluation of the
gestation as well as the maternal uterus and adnexal regions.

[Series 1: us ob less than 14 weeks with ob transvaginal · 14 of 56 slices shown]
[im 3/56]
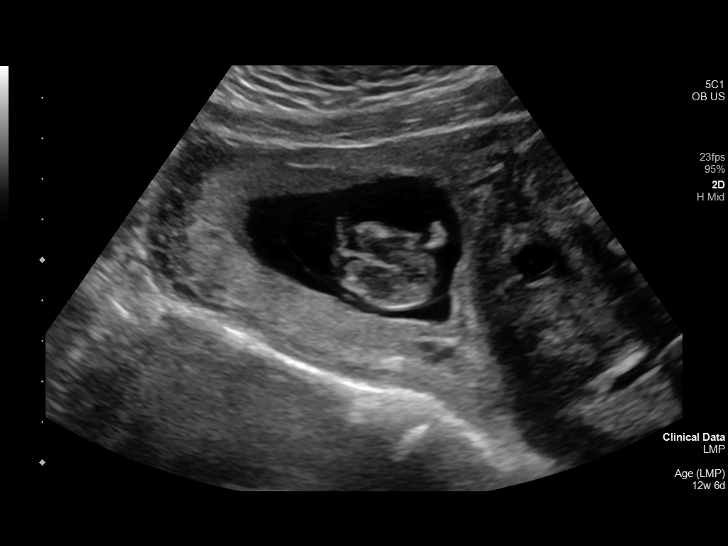
[im 7/56]
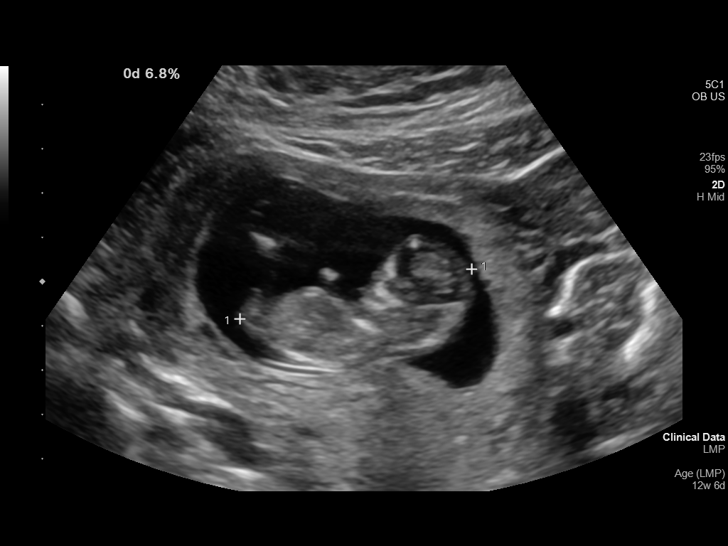
[im 11/56]
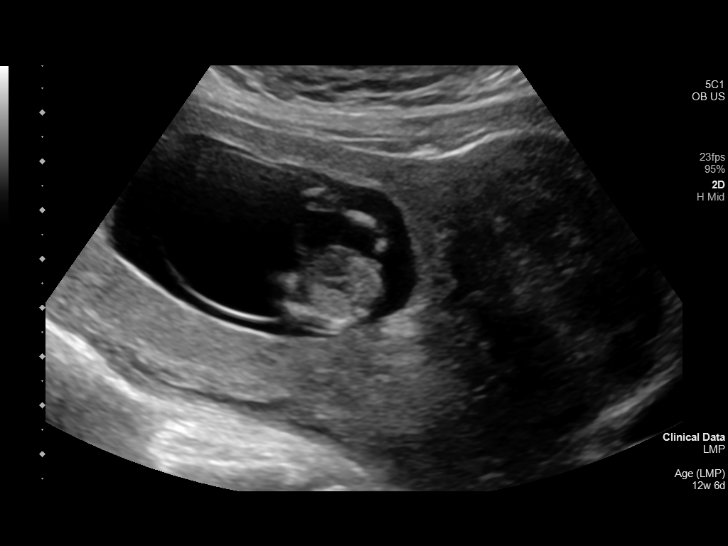
[im 15/56]
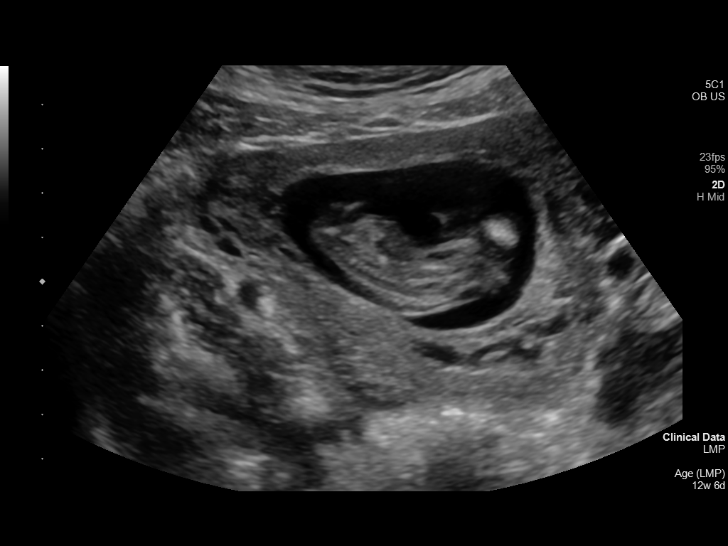
[im 19/56]
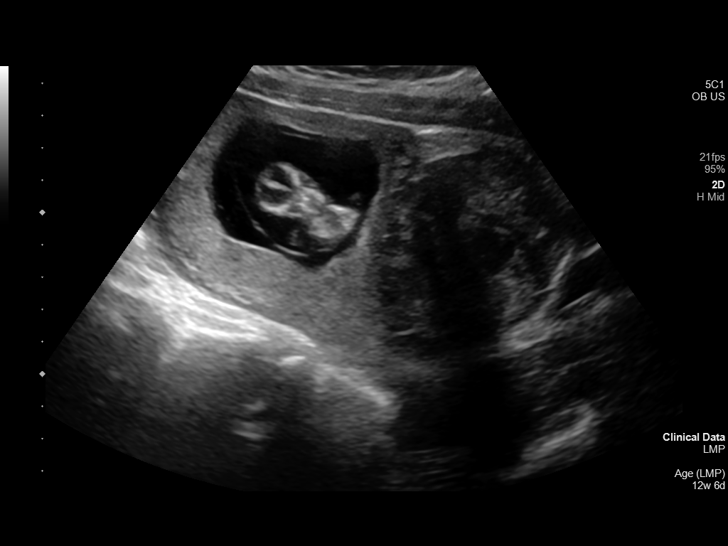
[im 23/56]
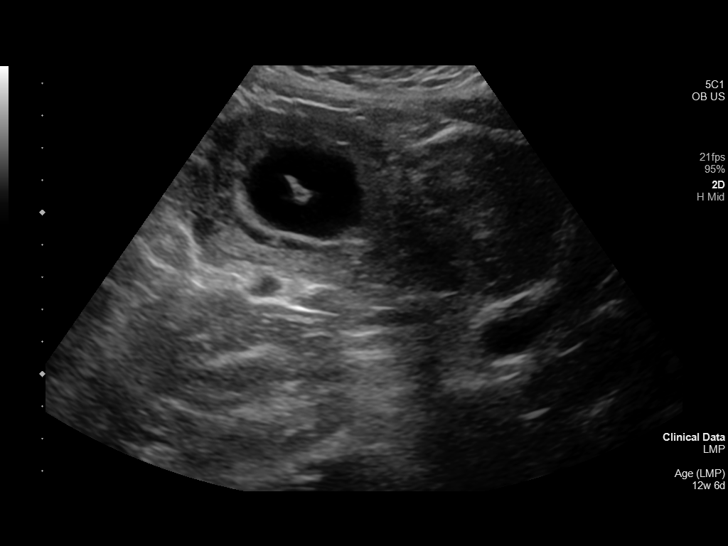
[im 27/56]
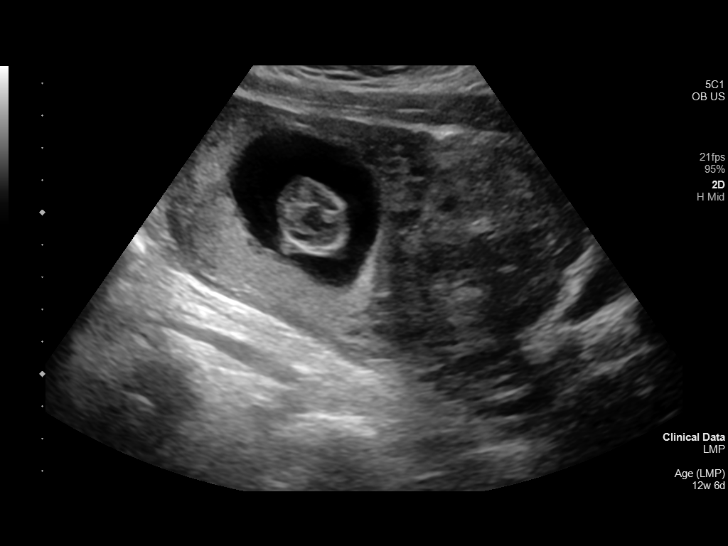
[im 31/56]
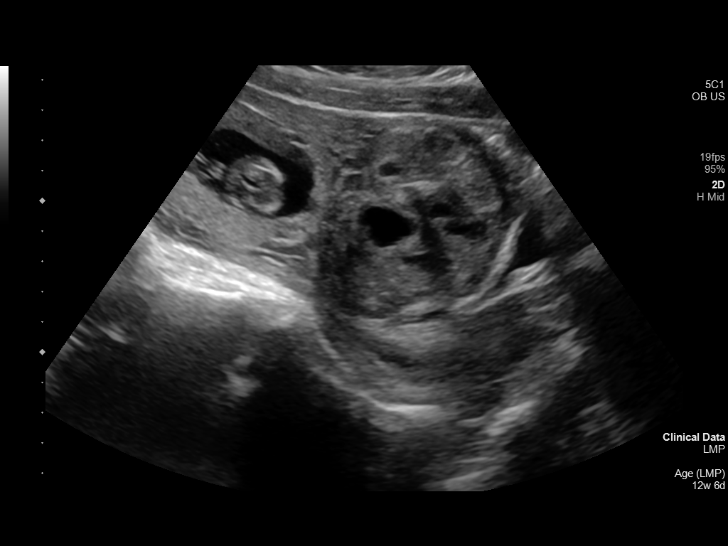
[im 35/56]
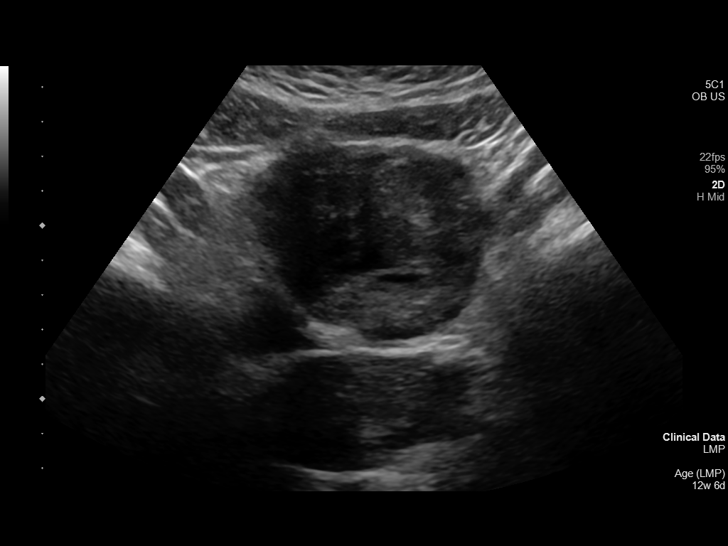
[im 39/56]
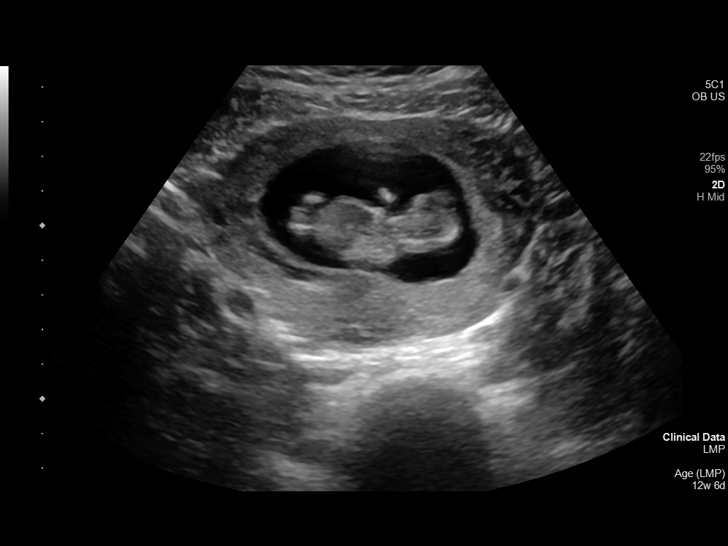
[im 43/56]
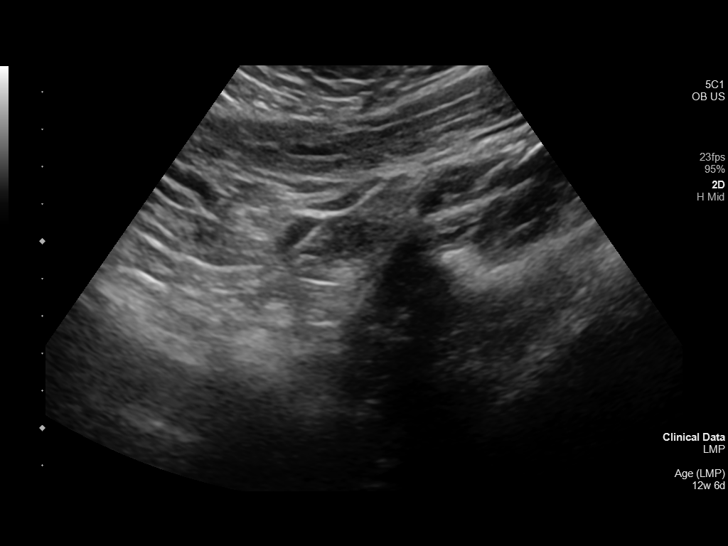
[im 47/56]
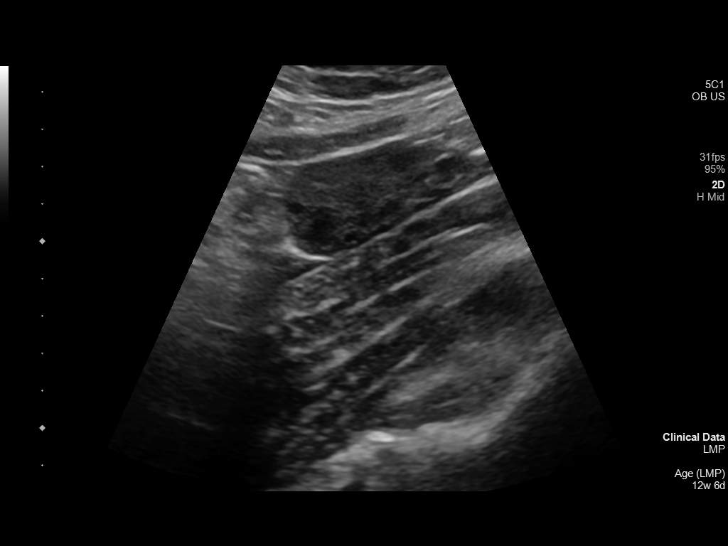
[im 51/56]
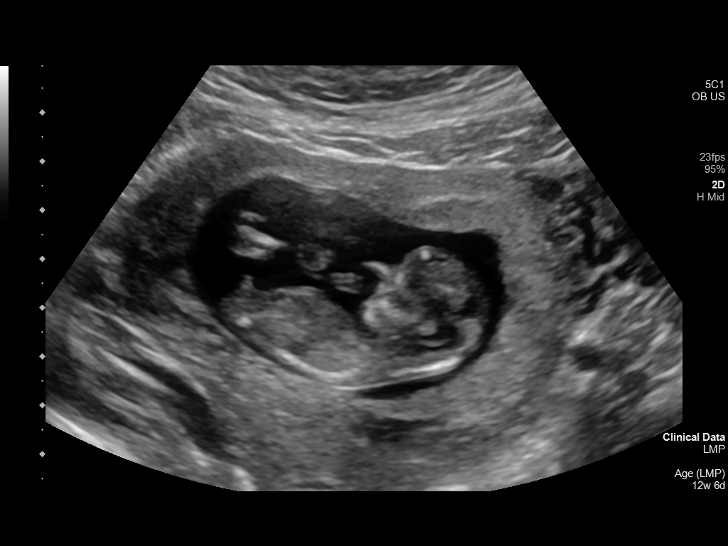
[im 56/56]
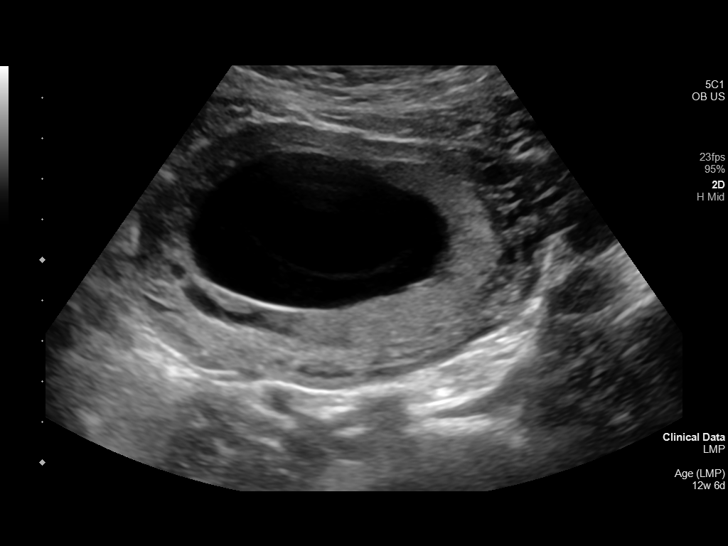

[14 of 28 positions shown; findings below may reference images not displayed]

FINDINGS: Intrauterine gestational sac: Single

Yolk sac:  Not Visualized.

Embryo:  Visualized.

Cardiac Activity: Visualized.

Heart Rate: 164 bpm

CRL: 53.5 mm   12 w 0 d                  US EDC: 06/08/2021

Subchorionic hemorrhage:  None visualized.

Maternal uterus/adnexae: Anteverted maternal uterus. Heterogeneous
anterior fibroid along the uterine body measuring approximately
x 6 x 7.2 cm. No other focal uterine abnormality. Right ovary is not
visualized. Normal appearance of the left ovary. No free pelvic
fluid.
IMPRESSION: 1. Single viable intrauterine gestation at 12 weeks 0 days by
crown-rump length sonographic estimation.
2. Nonvisualization of the yolk sac is a physiologic finding after
10 weeks.
3. Fibroid uterus with a 7.5 cm anterior probable leiomyoma.
4. Nonvisualization of right ovary.

## 2022-02-27 IMAGING — US US OB COMP +14 WK
1 series · 15 of 28 positions shown · non-contrast
Comparison: none

CLINICAL DATA: Second trimester pregnancy for fetal anatomy survey.

EXAM:
OBSTETRICAL ULTRASOUND >14 WKS

[Series 1: us ob comp + 14 wk · 15 of 94 slices shown]
[im 1/94]
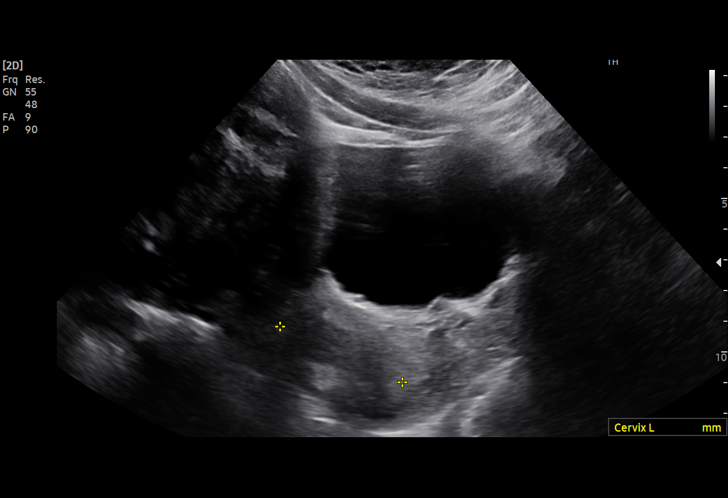
[im 7/94]
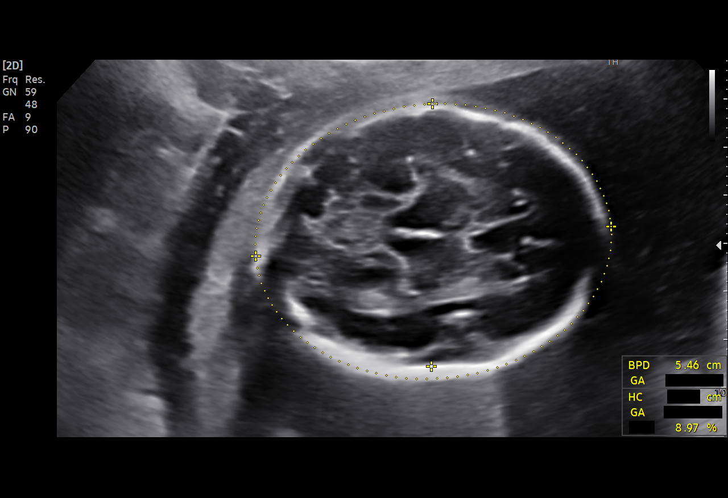
[im 14/94]
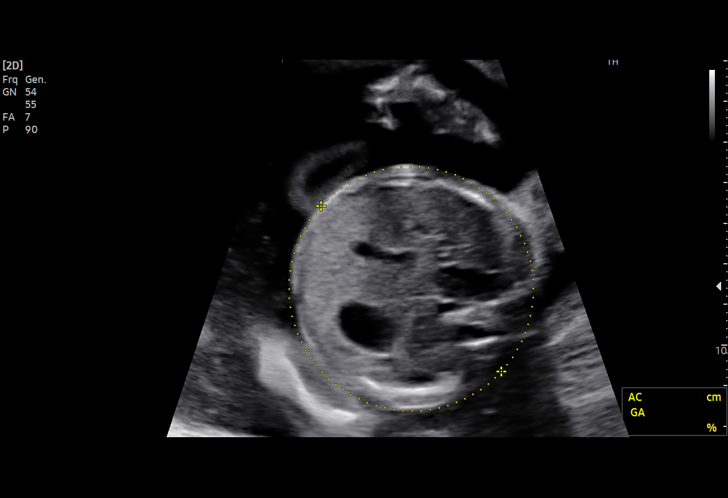
[im 21/94]
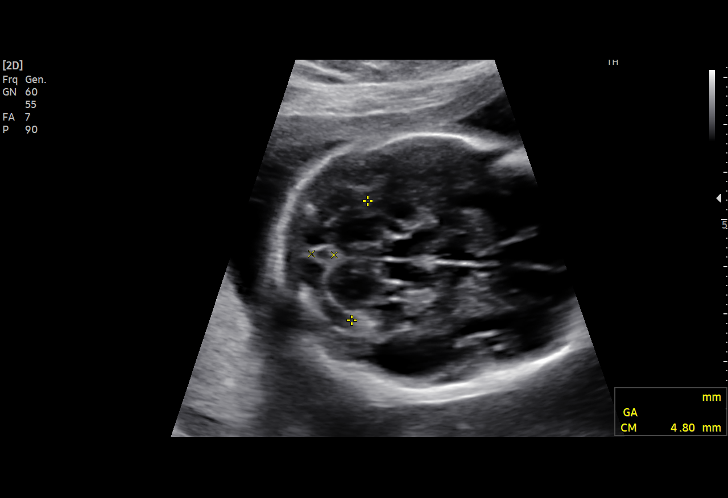
[im 28/94]
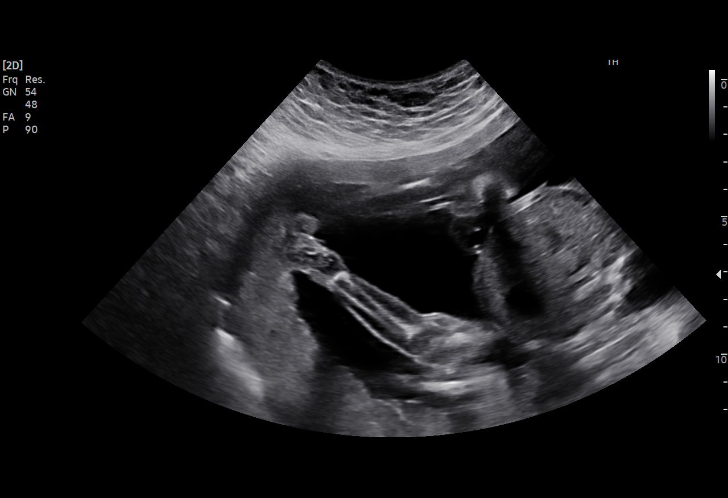
[im 35/94]
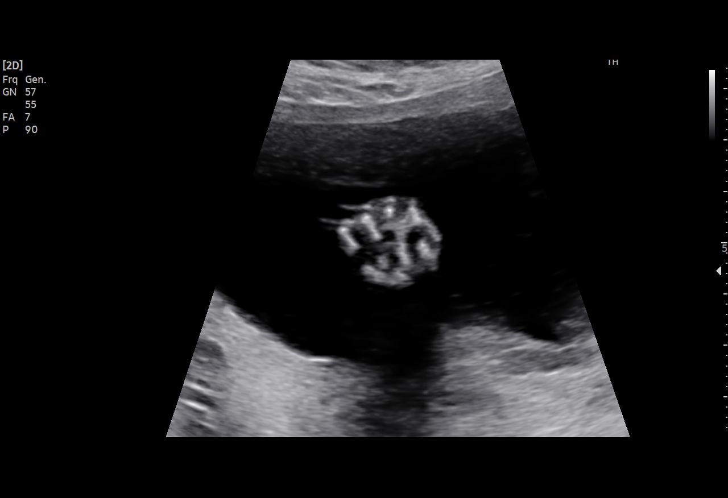
[im 42/94]
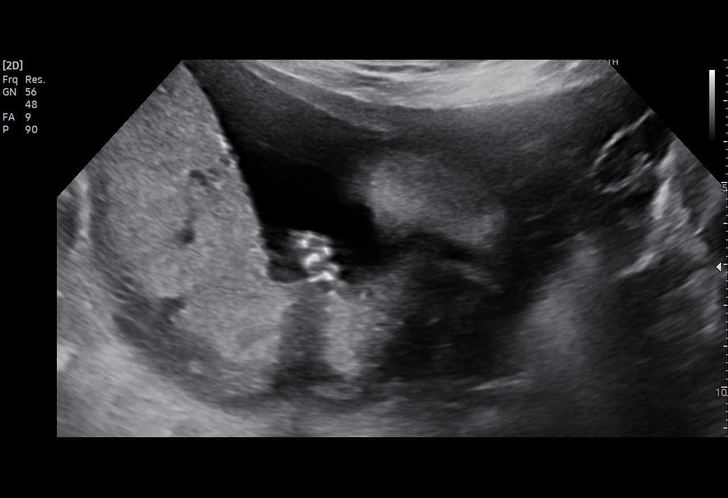
[im 49/94]
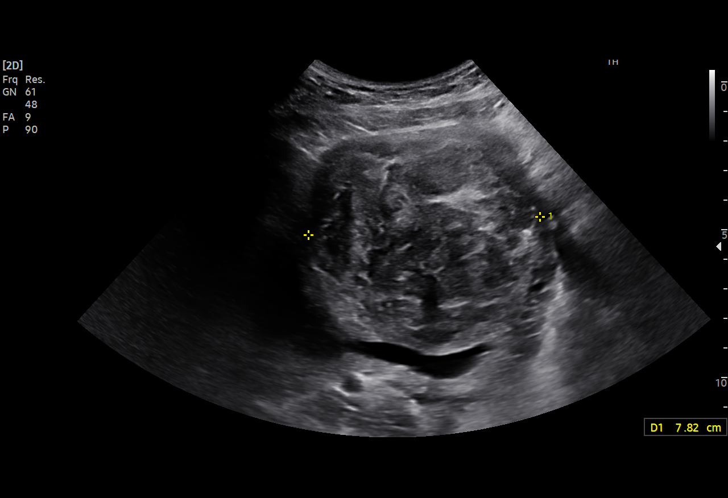
[im 52/94]
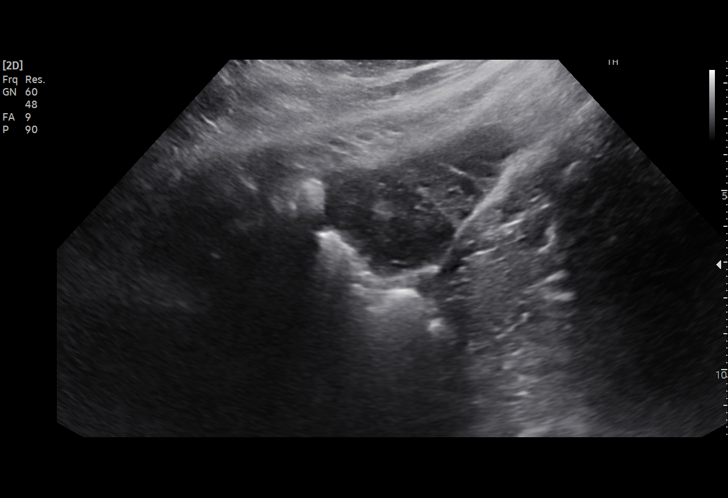
[im 59/94]
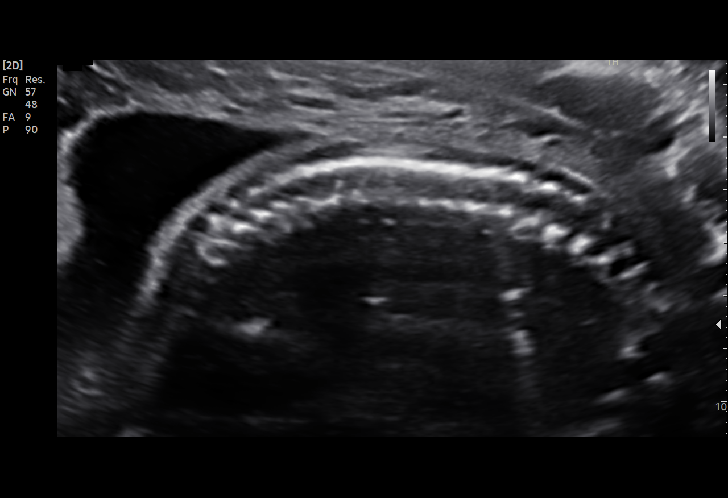
[im 66/94]
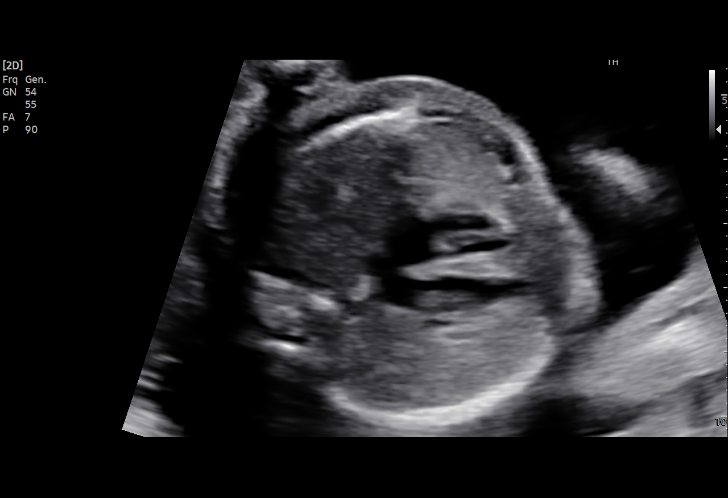
[im 73/94]
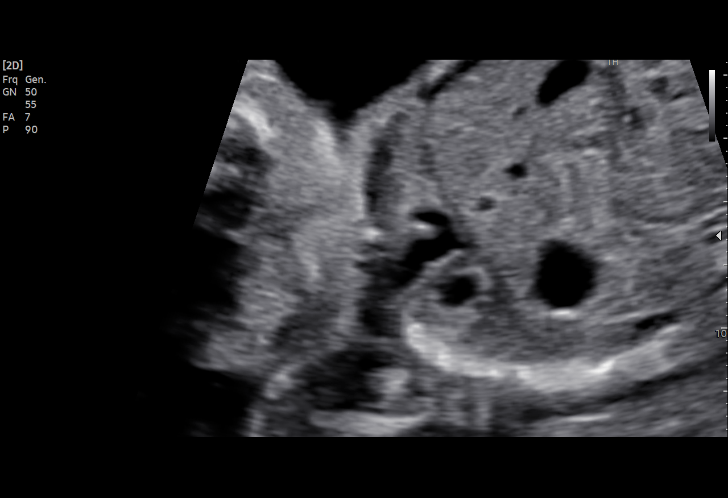
[im 80/94]
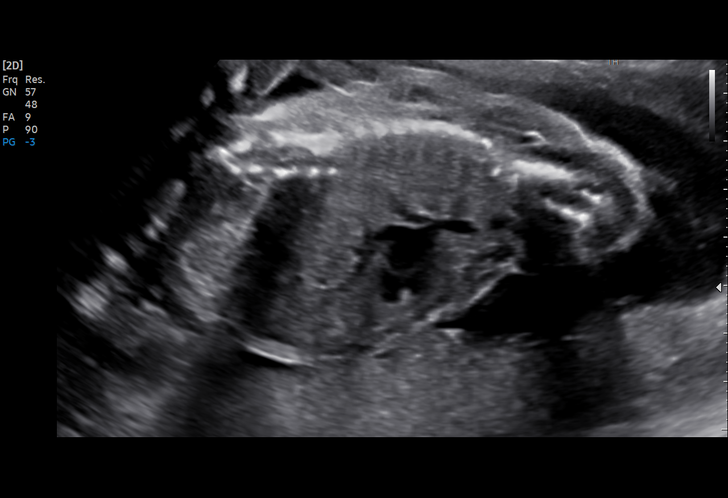
[im 87/94]
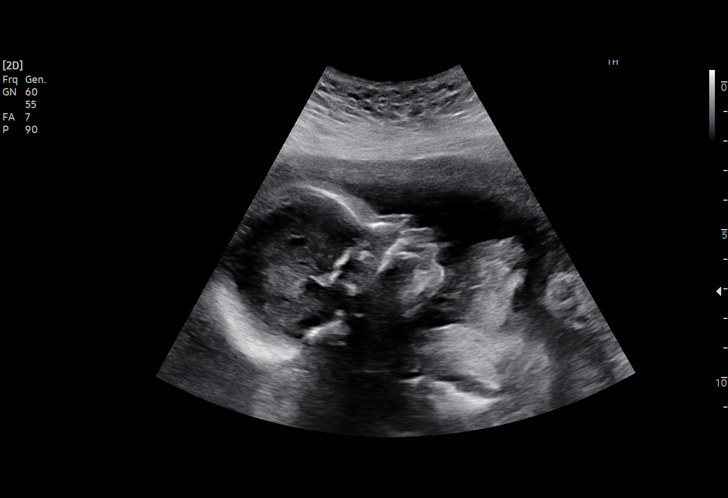
[im 94/94]
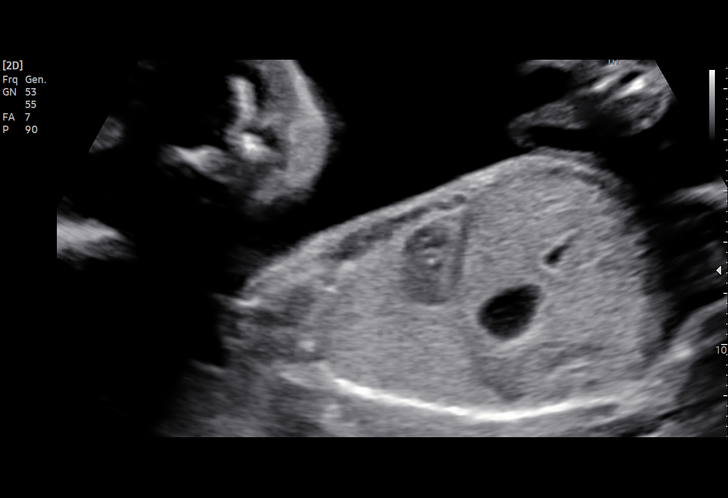

[15 of 28 positions shown; findings below may reference images not displayed]

FINDINGS: Number of Fetuses: 1

Heart Rate:  148 bpm

Movement: Yes

Presentation: Transverse lie with head to maternal right

Previa: No

Placental Location: Fundal

Amniotic Fluid (Subjective): Within normal limits

Amniotic Fluid (Objective):

Vertical pocket = 8.6cm

FETAL BIOMETRY

BPD: 5.5cm 22w 6d

HC:   20.6cm 22w 5d

AC:   19.0cm 23w 5d

FL:   4.3cm 24w 0d

Current Mean GA: 23w 2d US EDC: 06/05/2021

Assigned GA:  23w 5d Assigned EDC: 06/02/2021

FETAL ANATOMY

Lateral Ventricles: Appears normal

Thalami/CSP: Appears normal

Posterior Fossa:  Appears normal

Nuchal Region: Appears normal   NFT= N/A > 20 WKS

Upper Lip: Appears normal

Spine: Appears normal

4 Chamber Heart on Left: Appears normal

LVOT: Appears normal

RVOT: Appears normal

Stomach on Left: Appears normal

3 Vessel Cord: Appears normal

Cord Insertion site: Appears normal

Kidneys: Appears normal

Bladder: Appears normal

Extremities: Appears normal

Sex: Male

Maternal Findings:

Cervix:  4.3 cm TA

A large fibroid is again seen in the left anterior uterine corpus
which measures 7.8 cm in maximum diameter. This shows no significant
change compared to prior studies dating back to 11/24/2020.
IMPRESSION: Assigned GA currently 23 weeks 5 days.  Appropriate fetal growth.

Unremarkable fetal anatomic survey.  No fetal anomalies identified.

7.8 cm left anterior uterine fibroid, without significant change
since previous studies.
